# Patient Record
Sex: Female | Born: 1980 | Hispanic: Yes | Marital: Married | Smoking: Never smoker
Health system: Southern US, Community
[De-identification: ages and names within clinical notes are randomized; demographics above are authoritative.]

## PROBLEM LIST (undated history)

## (undated) ENCOUNTER — Inpatient Hospital Stay (HOSPITAL_COMMUNITY): Payer: Self-pay

## (undated) DIAGNOSIS — R0600 Dyspnea, unspecified: Secondary | ICD-10-CM

## (undated) DIAGNOSIS — K219 Gastro-esophageal reflux disease without esophagitis: Secondary | ICD-10-CM

## (undated) DIAGNOSIS — O24419 Gestational diabetes mellitus in pregnancy, unspecified control: Secondary | ICD-10-CM

## (undated) DIAGNOSIS — R7303 Prediabetes: Secondary | ICD-10-CM

## (undated) HISTORY — PX: NO PAST SURGERIES: SHX2092

---

## 2016-05-14 ENCOUNTER — Ambulatory Visit (INDEPENDENT_AMBULATORY_CARE_PROVIDER_SITE_OTHER): Payer: 59 | Admitting: Physician Assistant

## 2016-05-14 VITALS — HR 83 | Temp 98.6°F | Resp 16 | Ht 64.0 in | Wt 150.8 lb

## 2016-05-14 DIAGNOSIS — R1013 Epigastric pain: Secondary | ICD-10-CM

## 2016-05-14 DIAGNOSIS — K2970 Gastritis, unspecified, without bleeding: Secondary | ICD-10-CM

## 2016-05-14 LAB — POCT CBC
GRANULOCYTE PERCENT: 77.6 % (ref 37–80)
HEMATOCRIT: 37.5 % — AB (ref 37.7–47.9)
HEMOGLOBIN: 13.3 g/dL (ref 12.2–16.2)
Lymph, poc: 1.7 (ref 0.6–3.4)
MCH: 29.8 pg (ref 27–31.2)
MCHC: 35.4 g/dL (ref 31.8–35.4)
MCV: 84.3 fL (ref 80–97)
MID (cbc): 0.6 (ref 0–0.9)
MPV: 7.4 fL (ref 0–99.8)
POC GRANULOCYTE: 7.9 — AB (ref 2–6.9)
POC LYMPH PERCENT: 16.4 %L (ref 10–50)
POC MID %: 6 % (ref 0–12)
Platelet Count, POC: 203 10*3/uL (ref 142–424)
RBC: 4.45 M/uL (ref 4.04–5.48)
RDW, POC: 13.2 %
WBC: 10.2 10*3/uL (ref 4.6–10.2)

## 2016-05-14 MED ORDER — SUCRALFATE 1 GM/10ML PO SUSP: 1.0000 g | Freq: Three times a day (TID) | ORAL | 0 refills | Status: DC

## 2016-05-14 NOTE — Patient Instructions (Addendum)
Zantac 150mg  up to 2x/day if you get this in the future  Use the liquid medication that I gave you today for the next several days to help with the pain in your stomach  Eat a bland diet - be mindful of spicy and greasy foods as this will possibly make your pain worse   Gastritis, Adult Gastritis is soreness and swelling (inflammation) of the lining of the stomach. Gastritis can develop as a sudden onset (acute) or long-term (chronic) condition. If gastritis is not treated, it can lead to stomach bleeding and ulcers. CAUSES  Gastritis occurs when the stomach lining is weak or damaged. Digestive juices from the stomach then inflame the weakened stomach lining. The stomach lining may be weak or damaged due to viral or bacterial infections. One common bacterial infection is the Helicobacter pylori infection. Gastritis can also result from excessive alcohol consumption, taking certain medicines, or having too much acid in the stomach.  SYMPTOMS  In some cases, there are no symptoms. When symptoms are present, they may include:  Pain or a burning sensation in the upper abdomen.  Nausea.  Vomiting.  An uncomfortable feeling of fullness after eating. DIAGNOSIS  Your caregiver may suspect you have gastritis based on your symptoms and a physical exam. To determine the cause of your gastritis, your caregiver may perform the following:  Blood or stool tests to check for the H pylori bacterium.  Gastroscopy. A thin, flexible tube (endoscope) is passed down the esophagus and into the stomach. The endoscope has a light and camera on the end. Your caregiver uses the endoscope to view the inside of the stomach.  Taking a tissue sample (biopsy) from the stomach to examine under a microscope. TREATMENT  Depending on the cause of your gastritis, medicines may be prescribed. If you have a bacterial infection, such as an H pylori infection, antibiotics may be given. If your gastritis is caused by too  much acid in the stomach, H2 blockers or antacids may be given. Your caregiver may recommend that you stop taking aspirin, ibuprofen, or other nonsteroidal anti-inflammatory drugs (NSAIDs). HOME CARE INSTRUCTIONS  Only take over-the-counter or prescription medicines as directed by your caregiver.  If you were given antibiotic medicines, take them as directed. Finish them even if you start to feel better.  Drink enough fluids to keep your urine clear or pale yellow.  Avoid foods and drinks that make your symptoms worse, such as:  Caffeine or alcoholic drinks.  Chocolate.  Peppermint or mint flavorings.  Garlic and onions.  Spicy foods.  Citrus fruits, such as oranges, lemons, or limes.  Tomato-based foods such as sauce, chili, salsa, and pizza.  Fried and fatty foods.  Eat small, frequent meals instead of large meals. SEEK IMMEDIATE MEDICAL CARE IF:   You have black or dark red stools.  You vomit blood or material that looks like coffee grounds.  You are unable to keep fluids down.  Your abdominal pain gets worse.  You have a fever.  You do not feel better after 1 week.  You have any other questions or concerns. MAKE SURE YOU:  Understand these instructions.  Will watch your condition.  Will get help right away if you are not doing well or get worse.   This information is not intended to replace advice given to you by your health care provider. Make sure you discuss any questions you have with your health care provider.   Document Released: 08/20/2001 Document Revised: 02/25/2012 Document  Reviewed: 10/09/2011 Elsevier Interactive Patient Education [employer]2016 Elsevier Inc.   IF you received an x-ray today, you will receive an invoice from [facility]. Please contact [facility] at [phone] with questions or concerns regarding your invoice.   IF you received labwork today, you will receive an invoice from [facility].  Please contact [facility] at [phone] with questions or concerns regarding your invoice.   Our billing staff will not be able to assist you with questions regarding bills from these companies.  You will be contacted with the lab results as soon as they are available. The fastest way to get your results is to activate your My Chart account. Instructions are located on the last page of this paperwork. If you have not heard from us regarding the results in 2 weeks, please contact this office.

## 2016-05-14 NOTE — Progress Notes (Signed)
Cassandra Peters  MRN: [id number] DOB: 06-29-1981  Subjective:  Pt presents to clinic with epigastric abd pain that started about 12 hours ago after eating at [facility] and ate a lot of different food than she normally does that was fried - the pain has not changed since it started.  She felt nauseated and she vomited once this am but it only made her feel slightly better.  She is currently not nauseated.  Laying makes the worse pain - standing makes her feel a little relief.  She tried something OTC to help with nausea about 6am.  No F/C and no sick contacts.  She drinks on ETOH and has a cup of coffee in the am with cream.  She has felt constipated for the last 2 days - yesterday am she had a normal BM for her.    Post partum  5 months- breastfeeding  Husband with patient - he interprets  Review of Systems  Constitutional: Negative for chills and fever.  Gastrointestinal: Positive for abdominal pain, constipation (2 days), nausea and vomiting. Negative for anal bleeding.  Genitourinary: Negative for dysuria and urgency.    There are no active problems to display for this patient.   No current outpatient prescriptions on file prior to visit.   No current facility-administered medications on file prior to visit.     No Known Allergies  Pt patients past, family and social history were reviewed and updated.  Objective:  BP (P) 121/82 (BP Location: Left Arm, Patient Position: Supine, Cuff Size: Normal)   Pulse 83   Temp 98.6 F (37 C) (Oral)   Resp 16   Ht 5\' 4"  (1.626 m)   Wt 150 lb 12.8 oz (68.4 kg)   SpO2 98%   BMI 25.88 kg/m   Physical Exam  Constitutional: She is oriented to person, place, and time and well-developed, well-nourished, and in no distress.  HENT:  Head: Normocephalic and atraumatic.  Right Ear: Hearing and external ear normal.  Left Ear: Hearing and external ear normal.  Eyes: Conjunctivae are normal.  Neck: Normal range of motion.  Cardiovascular:  Normal rate, regular rhythm and normal heart sounds.   No murmur heard. Pulmonary/Chest: Effort normal and breath sounds normal. She has no wheezes.  Abdominal: Soft. Bowel sounds are normal. She exhibits no mass. There is tenderness (epigastric). There is no rebound, no guarding and negative Murphy's sign.  Neurological: She is alert and oriented to person, place, and time. Gait normal.  Skin: Skin is warm and dry.  Psychiatric: Mood, memory, affect and judgment normal.  Vitals reviewed.   Results for orders placed or performed in visit on 05/14/16  COMPLETE METABOLIC PANEL WITH GFR  Result Value Ref Range   Sodium 140 135 - 146 mmol/L   Potassium 3.8 3.5 - 5.3 mmol/L   Chloride 103 98 - 110 mmol/L   CO2 21 20 - 31 mmol/L   Glucose, Bld 111 (H) 65 - 99 mg/dL   BUN 10 7 - 25 mg/dL   Creat 4.09 8.11 - 9.14 mg/dL   Total Bilirubin 0.5 0.2 - 1.2 mg/dL   Alkaline Phosphatase 74 33 - 115 U/L   AST 13 10 - 30 U/L   ALT 11 6 - 29 U/L   Total Protein 7.1 6.1 - 8.1 g/dL   Albumin 4.2 3.6 - 5.1 g/dL   Calcium 9.3 8.6 - 78.2 mg/dL   GFR, Est African American >89 >=60 mL/min   GFR, Est Non African  American >89 >=60 mL/min  POCT CBC  Result Value Ref Range   WBC 10.2 4.6 - 10.2 K/uL   Lymph, poc 1.7 0.6 - 3.4   POC LYMPH PERCENT 16.4 10 - 50 %L   MID (cbc) 0.6 0 - 0.9   POC MID % 6.0 0 - 12 %M   POC Granulocyte 7.9 (A) 2 - 6.9   Granulocyte percent 77.6 37 - 80 %G   RBC 4.45 4.04 - 5.48 M/uL   Hemoglobin 13.3 12.2 - 16.2 g/dL   HCT, POC 16.137.5 (A) 09.637.7 - 47.9 %   MCV 84.3 80 - 97 fL   MCH, POC 29.8 27 - 31.2 pg   MCHC 35.4 31.8 - 35.4 g/dL   RDW, POC 04.513.2 %   Platelet Count, POC 203 142 - 424 K/uL   MPV 7.4 0 - 99.8 fL    Assessment and Plan :  Abdominal pain, epigastric - Plan: POCT CBC, COMPLETE METABOLIC PANEL WITH GFR  Gastritis - Plan: sucralfate (CARAFATE) 1 GM/10ML suspension   With normal LFTs - unlikely to be gallbladder - gastritis is the most likely with her new  fried foods that she ate - we will treat her symptoms with carafate and she will eat bland foods and be aware that caffeinated beverages may make this worse.  Questions were answered and they agree with the plan.  Warning signs of when to RTC were discussed with patient and husband.  Benny LennertSarah Weber PA-C  [facility] 05/16/2016 12:11 PM

## 2016-05-14 NOTE — Progress Notes (Signed)
Patient comes in with sudden epigastric intermit sharp pains that started after [place] [employer] [place] 1 [place] ago, 75month old baby, breastfeeding Vomited x2, last bm yesterday morning

## 2016-05-15 LAB — COMPLETE METABOLIC PANEL WITH GFR
ALT: 11 U/L (ref 6–29)
AST: 13 U/L (ref 10–30)
Albumin: 4.2 g/dL (ref 3.6–5.1)
Alkaline Phosphatase: 74 U/L (ref 33–115)
BUN: 10 mg/dL (ref 7–25)
CHLORIDE: 103 mmol/L (ref 98–110)
CO2: 21 mmol/L (ref 20–31)
Calcium: 9.3 mg/dL (ref 8.6–10.2)
Creat: 0.59 mg/dL (ref 0.50–1.10)
Glucose, Bld: 111 mg/dL — ABNORMAL HIGH (ref 65–99)
POTASSIUM: 3.8 mmol/L (ref 3.5–5.3)
Sodium: 140 mmol/L (ref 135–146)
Total Bilirubin: 0.5 mg/dL (ref 0.2–1.2)
Total Protein: 7.1 g/dL (ref 6.1–8.1)

## 2016-05-16 ENCOUNTER — Encounter: Payer: Self-pay | Admitting: Physician Assistant

## 2016-08-13 ENCOUNTER — Ambulatory Visit (INDEPENDENT_AMBULATORY_CARE_PROVIDER_SITE_OTHER): Payer: 59 | Admitting: Physician Assistant

## 2016-08-13 VITALS — BP 122/72 | HR 74 | Temp 98.9°F | Resp 17 | Ht 63.5 in | Wt 152.0 lb

## 2016-08-13 DIAGNOSIS — K5900 Constipation, unspecified: Secondary | ICD-10-CM

## 2016-08-13 DIAGNOSIS — Z114 Encounter for screening for human immunodeficiency virus [HIV]: Secondary | ICD-10-CM

## 2016-08-13 DIAGNOSIS — Z833 Family history of diabetes mellitus: Secondary | ICD-10-CM | POA: Diagnosis not present

## 2016-08-13 DIAGNOSIS — K649 Unspecified hemorrhoids: Secondary | ICD-10-CM

## 2016-08-13 DIAGNOSIS — Z0000 Encounter for general adult medical examination without abnormal findings: Secondary | ICD-10-CM | POA: Diagnosis not present

## 2016-08-13 DIAGNOSIS — Z01419 Encounter for gynecological examination (general) (routine) without abnormal findings: Secondary | ICD-10-CM | POA: Diagnosis not present

## 2016-08-13 LAB — POCT GLYCOSYLATED HEMOGLOBIN (HGB A1C): Hemoglobin A1C: 5.6

## 2016-08-13 MED ORDER — POLYETHYLENE GLYCOL 3350 17 GM/SCOOP PO POWD: 17.0000 g | Freq: Two times a day (BID) | ORAL | 1 refills | Status: DC | PRN

## 2016-08-13 MED ORDER — HYDROCORTISONE 2.5 % RE CREA: 1.0000 "application " | TOPICAL_CREAM | Freq: Two times a day (BID) | RECTAL | 0 refills | Status: DC

## 2016-08-13 NOTE — Patient Instructions (Addendum)
Use anusol as directed for hemorrhoids. Relieving your constipation will also help with this. Use a small stool in front of your toilet to put your feet on while you have a bowel movement. This helps relax the muscles around your rectum while moving your bowels.  Please see instructions below for relief of constipation.   You will be contacted with the results of your lab work when it results.  Thank you for coming in today. I hope you feel we met your needs.  Feel free to call [facility] if you have any questions or further requests.  Please consider signing up for MyChart if you do not already have it, as this is a great way to communicate with me.  Best,  Whitney McVey, PA-C  For constipation   Make sure you are drinking enough water daily. Increase to at least 2 liters a day.  Make sure you are getting enough fiber in your diet - this will make you regular - you can eat high fiber foods or use metamucil as a supplement - it is really important to drink enough water when using fiber supplements.   If your stools are hard or are formed balls or you have to strain a stool softener will help - use colace 2-3 capsule a day  For gentle treatment of constipation Use Miralax 1-2 capfuls a day until your stools are soft and regular and then decrease the usage - you can use this daily  For more aggressive treatment of constipation Use 4 capfuls of Colace and 6 doses of Miralax and drink it in 2 hours - this should result in several watery stools - if it does not repeat the next day and then go to daily miralax for a week to make sure your bowels are clean and retrained to work properly  For the most aggressive treatment of constipation Use 14 capfuls of Miralax in 1 gallon of fluid (gatoraid or water work well or a combination of the two) and drink over 12h - it is ok to eat during this time and then use Miralax 1 capful daily for about 2 weeks to prevent the constipation from returning  Health  Maintenance, Female Introduction Adopting a healthy lifestyle and getting preventive care can go a long way to promote health and wellness. Talk with your health care provider about what schedule of regular examinations is right for you. This is a good chance for you to check in with your provider about disease prevention and staying healthy. In between checkups, there are plenty of things you can do on your own. Experts have done a lot of research about which lifestyle changes and preventive measures are most likely to keep you healthy. Ask your health care provider for more information. Weight and diet Eat a healthy diet  Be sure to include plenty of vegetables, fruits, low-fat dairy products, and lean protein.  Do not eat a lot of foods high in solid fats, added sugars, or salt.  Get regular exercise. This is one of the most important things you can do for your health.  Most adults should exercise for at least 150 minutes each week. The exercise should increase your heart rate and make you sweat (moderate-intensity exercise).  Most adults should also do strengthening exercises at least twice a week. This is in addition to the moderate-intensity exercise. Maintain a healthy weight  Body mass index (BMI) is a measurement that can be used to identify possible weight problems. It estimates body  fat based on height and weight. Your health care provider can help determine your BMI and help you achieve or maintain a healthy weight.  For females 32 years of age and older:  A BMI below 18.5 is considered underweight.  A BMI of 18.5 to 24.9 is normal.  A BMI of 25 to 29.9 is considered overweight.  A BMI of 30 and above is considered obese. Watch levels of cholesterol and blood lipids  You should start having your blood tested for lipids and cholesterol at 33 years of age, then have this test every 5 years.  You may need to have your cholesterol levels checked more often if:  Your lipid  or cholesterol levels are high.  You are older than 33 years of age.  You are at high risk for heart disease. Cancer screening Lung Cancer  Lung cancer screening is recommended for adults 107-35 years old who are at high risk for lung cancer because of a history of smoking.  A yearly low-dose CT scan of the lungs is recommended for people who:  Currently smoke.  Have quit within the past 15 years.  Have at least a 30-pack-year history of smoking. A pack year is smoking an average of one pack of cigarettes a day for 1 year.  Yearly screening should continue until it has been 15 years since you quit.  Yearly screening should stop if you develop a health problem that would prevent you from having lung cancer treatment. Breast Cancer  Practice breast self-awareness. This means understanding how your breasts normally appear and feel.  It also means doing regular breast self-exams. Let your health care provider know about any changes, no matter how small.  If you are in your 20s or 30s, you should have a clinical breast exam (CBE) by a health care provider every 1-3 years as part of a regular health exam.  If you are 68 or older, have a CBE every year. Also consider having a breast X-ray (mammogram) every year.  If you have a family history of breast cancer, talk to your health care provider about genetic screening.  If you are at high risk for breast cancer, talk to your health care provider about having an MRI and a mammogram every year.  Breast cancer gene (BRCA) assessment is recommended for women who have family members with BRCA-related cancers. BRCA-related cancers include:  Breast.  Ovarian.  Tubal.  Peritoneal cancers.  Results of the assessment will determine the need for genetic counseling and BRCA1 and BRCA2 testing. Cervical Cancer  Your health care provider may recommend that you be screened regularly for cancer of the pelvic organs (ovaries, uterus, and vagina).  This screening involves a pelvic examination, including checking for microscopic changes to the surface of your cervix (Pap test). You may be encouraged to have this screening done every 3 years, beginning at age 20.  For women ages 16-65, health care providers may recommend pelvic exams and Pap testing every 3 years, or they may recommend the Pap and pelvic exam, combined with testing for human papilloma virus (HPV), every 5 years. Some types of HPV increase your risk of cervical cancer. Testing for HPV may also be done on women of any age with unclear Pap test results.  Other health care providers may not recommend any screening for nonpregnant women who are considered low risk for pelvic cancer and who do not have symptoms. Ask your health care provider if a screening pelvic exam is right for  you.  If you have had past treatment for cervical cancer or a condition that could lead to cancer, you need Pap tests and screening for cancer for at least 20 years after your treatment. If Pap tests have been discontinued, your risk factors (such as having a new sexual partner) need to be reassessed to determine if screening should resume. Some women have medical problems that increase the chance of getting cervical cancer. In these cases, your health care provider may recommend more frequent screening and Pap tests. Colorectal Cancer  This type of cancer can be detected and often prevented.  Routine colorectal cancer screening usually begins at 33 years of age and continues through 33 years of age.  Your health care provider may recommend screening at an earlier age if you have risk factors for colon cancer.  Your health care provider may also recommend using home test kits to check for hidden blood in the stool.  A small camera at the end of a tube can be used to examine your colon directly (sigmoidoscopy or colonoscopy). This is done to check for the earliest forms of colorectal cancer.  Routine  screening usually begins at age 53.  Direct examination of the colon should be repeated every 5-10 years through 33 years of age. However, you may need to be screened more often if early forms of precancerous polyps or small growths are found. Skin Cancer  Check your skin from head to toe regularly.  Tell your health care provider about any new moles or changes in moles, especially if there is a change in a mole's shape or color.  Also tell your health care provider if you have a mole that is larger than the size of a pencil eraser.  Always use sunscreen. Apply sunscreen liberally and repeatedly throughout the day.  Protect yourself by wearing long sleeves, pants, a wide-brimmed hat, and sunglasses whenever you are outside. Heart disease, diabetes, and high blood pressure  High blood pressure causes heart disease and increases the risk of stroke. High blood pressure is more likely to develop in:  People who have blood pressure in the high end of the normal range (130-139/85-89 mm Hg).  People who are overweight or obese.  People who are African American.  If you are 67-73 years of age, have your blood pressure checked every 3-5 years. If you are 36 years of age or older, have your blood pressure checked every year. You should have your blood pressure measured twice-once when you are at a hospital or clinic, and once when you are not at a hospital or clinic. Record the average of the two measurements. To check your blood pressure when you are not at a hospital or clinic, you can use:  An automated blood pressure machine at a pharmacy.  A home blood pressure monitor.  If you are between 92 years and 4 years old, ask your health care provider if you should take aspirin to prevent strokes.  Have regular diabetes screenings. This involves taking a blood sample to check your fasting blood sugar level.  If you are at a normal weight and have a low risk for diabetes, have this test once  every three years after 33 years of age.  If you are overweight and have a high risk for diabetes, consider being tested at a younger age or more often. Preventing infection Hepatitis B  If you have a higher risk for hepatitis B, you should be screened for this virus. You are  considered at high risk for hepatitis B if:  You were born in a country where hepatitis B is common. Ask your health care provider which countries are considered high risk.  Your parents were born in a high-risk country, and you have not been immunized against hepatitis B (hepatitis B vaccine).  You have HIV or AIDS.  You use needles to inject street drugs.  You live with someone who has hepatitis B.  You have had sex with someone who has hepatitis B.  You get hemodialysis treatment.  You take certain medicines for conditions, including cancer, organ transplantation, and autoimmune conditions. Hepatitis C  Blood testing is recommended for:  Everyone born from [birth year] through 1963.  Anyone with known risk factors for hepatitis C. Sexually transmitted infections (STIs)  You should be screened for sexually transmitted infections (STIs) including gonorrhea and chlamydia if:  You are sexually active and are younger than 33 years of age.  You are older than 33 years of age and your health care provider tells you that you are at risk for this type of infection.  Your sexual activity has changed since you were last screened and you are at an increased risk for chlamydia or gonorrhea. Ask your health care provider if you are at risk.  If you do not have HIV, but are at risk, it may be recommended that you take a prescription medicine daily to prevent HIV infection. This is called pre-exposure prophylaxis (PrEP). You are considered at risk if:  You are sexually active and do not regularly use condoms or know the HIV status of your partner(s).  You take drugs by injection.  You are sexually active with a partner  who has HIV. Talk with your health care provider about whether you are at high risk of being infected with HIV. If you choose to begin PrEP, you should first be tested for HIV. You should then be tested every 3 months for as long as you are taking PrEP. Pregnancy  If you are premenopausal and you may become pregnant, ask your health care provider about preconception counseling.  If you may become pregnant, take 400 to 800 micrograms (mcg) of folic acid every day.  If you want to prevent pregnancy, talk to your health care provider about birth control (contraception). Osteoporosis and menopause  Osteoporosis is a disease in which the bones lose minerals and strength with aging. This can result in serious bone fractures. Your risk for osteoporosis can be identified using a bone density scan.  If you are 37 years of age or older, or if you are at risk for osteoporosis and fractures, ask your health care provider if you should be screened.  Ask your health care provider whether you should take a calcium or vitamin D supplement to lower your risk for osteoporosis.  Menopause may have certain physical symptoms and risks.  Hormone replacement therapy may reduce some of these symptoms and risks. Talk to your health care provider about whether hormone replacement therapy is right for you. Follow these instructions at home:  Schedule regular health, dental, and eye exams.  Stay current with your immunizations.  Do not use any tobacco products including cigarettes, chewing tobacco, or electronic cigarettes.  If you are pregnant, do not drink alcohol.  If you are breastfeeding, limit how much and how often you drink alcohol.  Limit alcohol intake to no more than 1 drink per day for nonpregnant women. One drink equals 12 ounces of beer, 5 ounces of  wine, or 1 ounces of hard liquor.  Do not use street drugs.  Do not share needles.  Ask your health care provider for help if you need support  or information about quitting drugs.  Tell your health care provider if you often feel depressed.  Tell your health care provider if you have ever been abused or do not feel safe at home. This information is not intended to replace advice given to you by your health care provider. Make sure you discuss any questions you have with your health care provider. Document Released: 03/11/2011 Document Revised: 02/01/2016 Document Reviewed: 05/30/2015  2017 Elsevier   IF you received an x-ray today, you will receive an invoice from [facility]. Please contact [facility] at [phone] with questions or concerns regarding your invoice.   IF you received labwork today, you will receive an invoice from [employer]. Please contact [facility] at [phone] with questions or concerns regarding your invoice.   Our billing staff will not be able to assist you with questions regarding bills from these companies.  You will be contacted with the lab results as soon as they are available. The fastest way to get your results is to activate your My Chart account. Instructions are located on the last page of this paperwork. If you have not heard from [place] regarding the results in 2 weeks, please contact this office.

## 2016-08-13 NOTE — Progress Notes (Signed)
[facility] ([place]) [address] [place] [address] [phone]  Date:  08/13/2016   Name:  Cassandra Peters   DOB:  1980-09-10   MRN:  [id number]  PCP:  No PCP Per Patient    Chief Complaint: female issues   History of Present Illness:  This is a 35 y.o. female with no reported PMH who is presenting for CPE.  She is from [place], here today with husband who is interpreting for her.   Complaints: Hemorrhoids. She had a normal vaginal delivery eight months ago and has had problems with hemorrhoids since that time. Only gives her problems  BM once every two days. Today she has not had a bowel movement in four days. Bright red blood in stool. Has tried drinking 2 Tbsp olive oil for her constipation, helps some.  Currently breast feeding.   LMP: 10 days ago. Normal. Contraception: None.  Last pap: Never.  Sexual history: Married x two years.  Immunizations: Decline flu shot today. Current on Tdap.  Dentist: Sees regulary Eye:  Not checked.  Diet/Exercise: Cooks at home only: Eats a healthy diet of fruit, vegetables, meat, chicken. Does not exercise.  Fam hx: Mother diabetes, HTN.  Tobacco/alcohol/substance use: None  Review of Systems:  Review of Systems  Constitutional: Negative for chills, diaphoresis, fatigue and fever.  HENT: Negative for congestion, postnasal drip, rhinorrhea, sinus pressure, sneezing and sore throat.   Respiratory: Negative for cough, chest tightness, shortness of breath and wheezing.   Cardiovascular: Negative for chest pain and palpitations.  Gastrointestinal: Positive for constipation and rectal pain. Negative for abdominal pain, diarrhea, nausea and vomiting.  Neurological: Negative for weakness, light-headedness and headaches.    There are no active problems to display for this patient.   Prior to Admission medications   Medication Sig Start Date End Date Taking? Authorizing Provider  sucralfate (CARAFATE) 1 GM/10ML suspension Take  10 mLs (1 g total) by mouth 4 (four) times daily -  with meals and at bedtime. 05/14/16  Yes Mancel Bale, PA-C    No Known Allergies  No past surgical history on file.  Social History  Substance Use Topics  . Smoking status: Never Smoker  . Smokeless tobacco: Never Used  . Alcohol use No    Family History  Problem Relation Age of Onset  . Diabetes Mother   . Hypertension Mother     Medication list has been reviewed and updated.  Physical Examination:  Physical Exam  Constitutional: She is oriented to person, place, and time. She appears well-developed and well-nourished. No distress.  HENT:  Head: Normocephalic and atraumatic.  Right Ear: Tympanic membrane normal.  Left Ear: Tympanic membrane normal.  Mouth/Throat: Oropharynx is clear and moist and mucous membranes are normal.  Eyes: Conjunctivae and EOM are normal. Pupils are equal, round, and reactive to light.  Neck: Normal range of motion. Neck supple.  Cardiovascular: Normal rate, regular rhythm and normal heart sounds.   No murmur heard. Pulmonary/Chest: Effort normal and breath sounds normal. She has no wheezes. Right breast exhibits no tenderness. Left breast exhibits no tenderness.  No masses noted in breasts or axilla b/l.   Abdominal: Soft. Normal appearance and bowel sounds are normal. She exhibits no mass. There is no tenderness.  Genitourinary: Vagina normal.  Musculoskeletal: Normal range of motion.  Neurological: She is alert and oriented to person, place, and time. She has normal reflexes.  Skin: Skin is warm and dry.  Psychiatric: She has a  normal mood and affect. Her behavior is normal. Judgment and thought content normal.  Vitals reviewed.   BP 122/72 (BP Location: Right Arm, Patient Position: Sitting, Cuff Size: Normal)   Pulse 74   Temp 98.9 F (37.2 C) (Oral)   Resp 17   Ht 5' 3.5" (1.613 m)   Wt 152 lb (68.9 kg)   LMP 07/30/2016 (Approximate)   SpO2 98%   BMI 26.50 kg/m   Assessment  and Plan: 1. Annual physical exam 2. Encounter for routine GYN examination  - Lipid panel - CBC - HIV antibody - Pap IG and HPV (high risk) DNA detection - Chris is a healthy 35 year old breastfeeding female. She recently gave birth to her first child, currently breastfeeding with no problems. Encouraged regular exercise, increased fluids and healthy diet. Labs pending. Will contact with results.   3. Hemorrhoids, unspecified hemorrhoid type - hydrocortisone (ANUSOL-HC) 2.5 % rectal cream; Place 1 application rectally 2 (two) times daily.  Dispense: 30 g; Refill: 0  4. Constipation, unspecified constipation type - polyethylene glycol powder (GLYCOLAX/MIRALAX) powder; Take 17 g by mouth 2 (two) times daily as needed.  Dispense: 578 g; Refill: 1  5. Screening for HIV (human immunodeficiency virus) 6. Family history of diabetes mellitus - POCT glycosylated hemoglobin (Hb A1C) - CMP14+EGFR   Mercer Pod, PA-C  [facility] 08/13/2016 10:09 AM

## 2016-08-14 LAB — CMP14+EGFR
ALT: 16 IU/L (ref 0–32)
AST: 17 IU/L (ref 0–40)
Albumin/Globulin Ratio: 2 (ref 1.2–2.2)
Albumin: 4.5 g/dL (ref 3.5–5.5)
Alkaline Phosphatase: 71 IU/L (ref 39–117)
BUN/Creatinine Ratio: 21 (ref 9–23)
BUN: 12 mg/dL (ref 6–20)
Bilirubin Total: <0.2 mg/dL (ref 0.0–1.2)
CO2: 23 mmol/L (ref 18–29)
Calcium: 9.5 mg/dL (ref 8.7–10.2)
Chloride: 106 mmol/L (ref 96–106)
Creatinine, Ser: 0.57 mg/dL (ref 0.57–1.00)
GFR calc Af Amer: 141 mL/min/{1.73_m2} (ref >59)
GFR calc non Af Amer: 122 mL/min/{1.73_m2} (ref >59)
Globulin, Total: 2.3 g/dL (ref 1.5–4.5)
Glucose: 84 mg/dL (ref 65–99)
Potassium: 4.1 mmol/L (ref 3.5–5.2)
Sodium: 145 mmol/L — ABNORMAL HIGH (ref 134–144)
Total Protein: 6.8 g/dL (ref 6.0–8.5)

## 2016-08-14 LAB — CBC
Hematocrit: 36.7 % (ref 34.0–46.6)
Hemoglobin: 12.8 g/dL (ref 11.1–15.9)
MCH: 30.7 pg (ref 26.6–33.0)
MCHC: 34.9 g/dL (ref 31.5–35.7)
MCV: 88 fL (ref 79–97)
Platelets: 255 10*3/uL (ref 150–379)
RBC: 4.17 x10E6/uL (ref 3.77–5.28)
RDW: 13.8 % (ref 12.3–15.4)
WBC: 5.7 10*3/uL (ref 3.4–10.8)

## 2016-08-14 LAB — LIPID PANEL
Chol/HDL Ratio: 2.9 ratio units (ref 0.0–4.4)
Cholesterol, Total: 158 mg/dL (ref 100–199)
HDL: 54 mg/dL (ref >39)
LDL Calculated: 91 mg/dL (ref 0–99)
Triglycerides: 65 mg/dL (ref 0–149)
VLDL Cholesterol Cal: 13 mg/dL (ref 5–40)

## 2016-08-14 LAB — HIV ANTIBODY (ROUTINE TESTING W REFLEX): HIV Screen 4th Generation wRfx: NONREACTIVE

## 2016-08-17 LAB — PAP IG AND HPV HIGH-RISK
HPV, high-risk: NEGATIVE
PAP Smear Comment: 0

## 2016-08-31 ENCOUNTER — Encounter: Payer: Self-pay | Admitting: Physician Assistant

## 2017-05-05 LAB — OB RESULTS CONSOLE RUBELLA ANTIBODY, IGM: RUBELLA: IMMUNE

## 2017-05-05 LAB — OB RESULTS CONSOLE ANTIBODY SCREEN: Antibody Screen: NEGATIVE

## 2017-05-05 LAB — OB RESULTS CONSOLE RPR: RPR: NONREACTIVE

## 2017-05-05 LAB — OB RESULTS CONSOLE ABO/RH: RH TYPE: POSITIVE

## 2017-05-05 LAB — OB RESULTS CONSOLE HIV ANTIBODY (ROUTINE TESTING): HIV: NONREACTIVE

## 2017-05-05 LAB — OB RESULTS CONSOLE HEPATITIS B SURFACE ANTIGEN: HEP B S AG: NEGATIVE

## 2017-09-09 NOTE — L&D Delivery Note (Addendum)
Operative Delivery Note  At 12:23 PM a viable female was delivered via Vaginal, Vacuum Investment banker, operational(Extractor).  Presentation: vertex; Position: Occiput,, Anterior; Station: +4.  Verbal consent: obtained from patient.  Risks and benefits discussed in detail.  Risks include, but are not limited to the risks of anesthesia, bleeding, infection, damage to maternal tissues, fetal cephalhematoma.  There is also the risk of inability to effect vaginal delivery of the head, or shoulder dystocia that cannot be resolved by established maneuvers, leading to the need for emergency cesarean section.  Delivery performed by Dr. Erin FullingHarraway-Smith and Dr. Raynelle FanningJulie Degele due to fetal bradycardia and need for emergent delivery.    APGAR: 5, 8; weight 5 lb 13 oz (2635 g).   Placenta status: to pathology Cord:  with the following complications: .  Cord pH: pending  Anesthesia:  Local Instruments: Kiwi Episiotomy: None Lacerations: 3rd degree (3b) Repair was performed in the usual fashion. Suture Repair: 2.0 3.0 vicryl Est. Blood Loss (mL): 350  Mom to postpartum.  Baby to Couplet care / Skin to Skin.  Cassandra SellerJennifer M Laquiesha Peters 12/11/2017, 2:17 PM

## 2017-10-10 ENCOUNTER — Encounter: Payer: 59 | Attending: Obstetrics & Gynecology | Admitting: *Deleted

## 2017-10-10 DIAGNOSIS — R7309 Other abnormal glucose: Secondary | ICD-10-CM

## 2017-10-10 DIAGNOSIS — O99810 Abnormal glucose complicating pregnancy: Secondary | ICD-10-CM | POA: Diagnosis not present

## 2017-10-10 DIAGNOSIS — Z3A00 Weeks of gestation of pregnancy not specified: Secondary | ICD-10-CM | POA: Insufficient documentation

## 2017-10-10 DIAGNOSIS — Z713 Dietary counseling and surveillance: Secondary | ICD-10-CM | POA: Diagnosis not present

## 2017-10-10 NOTE — Progress Notes (Signed)
  Patient was seen on 10/10/2017 for Gestational Diabetes self-management . Used [employer] interpretor for Arabic language. Husband also participated in the visit. The following learning objectives were met by the patient :   States the definition of Gestational Diabetes  States why dietary management is important in controlling blood glucose  Describes the effects of carbohydrates on blood glucose levels  Demonstrates ability to create a balanced meal plan  Demonstrates carbohydrate counting   States when to check blood glucose levels  Demonstrates proper blood glucose monitoring techniques  States the effect of stress and exercise on blood glucose levels  States the importance of limiting caffeine and abstaining from alcohol and smoking  Plan:  Aim for 3 Carb Choices per meal (45 grams) +/- 1 either way  Aim for 1-2 Carbs per snack Begin reading food labels for Total Carbohydrate of foods Consider  increasing your activity level by walking or other activity daily as tolerated Begin checking BG before breakfast and 2 hours after first bite of breakfast, lunch and dinner as directed by MD  Bring Log Book to every medical appointment   Take medication if directed by MD  Patient already has a meter:  And is testing pre breakfast and 2 hours each meal as directed by MD Review of Log Book shows: FBG are below 95 and post meal are all below 120 mg/dl except for one lunch test. She realizes she had OJ at that meal, so will stop drinking juice for now.    Patient instructed to monitor glucose levels: FBS: 60 - 95 mg/dl 2 hour: <120 mg/dl  Patient received the following handouts: in Arabic  Nutrition Diabetes and Pregnancy  Carbohydrate Counting List  Patient will be seen for follow-up as needed.

## 2017-11-10 ENCOUNTER — Encounter (HOSPITAL_COMMUNITY): Payer: Self-pay | Admitting: *Deleted

## 2017-11-10 ENCOUNTER — Inpatient Hospital Stay (HOSPITAL_COMMUNITY)
Admission: AD | Admit: 2017-11-10 | Discharge: 2017-11-10 | Disposition: A | Discharge status: Home / Self Care | Payer: 59 | Source: Ambulatory Visit | Attending: Obstetrics & Gynecology | Admitting: Obstetrics & Gynecology

## 2017-11-10 ENCOUNTER — Inpatient Hospital Stay (HOSPITAL_COMMUNITY): Payer: 59

## 2017-11-10 DIAGNOSIS — O368330 Maternal care for abnormalities of the fetal heart rate or rhythm, third trimester, not applicable or unspecified: Secondary | ICD-10-CM | POA: Insufficient documentation

## 2017-11-10 DIAGNOSIS — O24419 Gestational diabetes mellitus in pregnancy, unspecified control: Secondary | ICD-10-CM | POA: Diagnosis not present

## 2017-11-10 DIAGNOSIS — Z79899 Other long term (current) drug therapy: Secondary | ICD-10-CM | POA: Diagnosis not present

## 2017-11-10 DIAGNOSIS — Z3689 Encounter for other specified antenatal screening: Secondary | ICD-10-CM

## 2017-11-10 DIAGNOSIS — O288 Other abnormal findings on antenatal screening of mother: Secondary | ICD-10-CM | POA: Insufficient documentation

## 2017-11-10 DIAGNOSIS — Z8249 Family history of ischemic heart disease and other diseases of the circulatory system: Secondary | ICD-10-CM | POA: Insufficient documentation

## 2017-11-10 DIAGNOSIS — Z833 Family history of diabetes mellitus: Secondary | ICD-10-CM | POA: Diagnosis not present

## 2017-11-10 DIAGNOSIS — Z3A34 34 weeks gestation of pregnancy: Secondary | ICD-10-CM | POA: Insufficient documentation

## 2017-11-10 HISTORY — DX: Gestational diabetes mellitus in pregnancy, unspecified control: O24.419

## 2017-11-10 NOTE — Discharge Instructions (Signed)
Keep your appointments in the office. Return if you are having regular contractions, leaking of fluid or vaginal bleeding.

## 2017-11-10 NOTE — MAU Note (Signed)
Pt sent from MD office, non-reactive NST there.  Pt C/O cramping in lower back, denies bleeding or LOF.  Reports good fetal movement.

## 2017-11-10 NOTE — MAU Provider Note (Signed)
  History     CSN: [id number]  Arrival date and time: 11/10/17 1628   First Provider Initiated Contact with Patient 11/10/17 1716      Chief Complaint  Patient presents with  . non reactive NST   HPI Cassandra Peters 37 y.o. 5813w5d Arabic interpreter used for interview, exam and discharge.   Sent from the office with nonreactive NST.  Needs prolonged monitoring with BPP here.  Client is having some contractions but not very different from the contractions that she has had in the last few weeks.  No bleeding and no leaking.  Not having pain.  OB History    Gravida Para Term Preterm AB Living   2 1 1     1    SAB TAB Ectopic Multiple Live Births                  Past Medical History:  Diagnosis Date  . Gestational diabetes     Past Surgical History:  Procedure Laterality Date  . NO PAST SURGERIES      Family History  Problem Relation Age of Onset  . Diabetes Mother   . Hypertension Mother     Social History   Tobacco Use  . Smoking status: Never Smoker  . Smokeless tobacco: Never Used  Substance Use Topics  . Alcohol use: No  . Drug use: No    Allergies: No Known Allergies  Medications Prior to Admission  Medication Sig Dispense Refill Last Dose  . hydrocortisone (ANUSOL-HC) 2.5 % rectal cream Place 1 application rectally 2 (two) times daily. 30 g 0   . polyethylene glycol powder (GLYCOLAX/MIRALAX) powder Take 17 g by mouth 2 (two) times daily as needed. 578 g 1   . Prenatal Vit-Fe Fumarate-FA (MULTIVITAMIN-PRENATAL) 27-0.8 MG TABS tablet Take 1 tablet by mouth daily at 12 noon.       Review of Systems  Constitutional: Negative for fever.  Gastrointestinal: Negative for abdominal pain.  Genitourinary: Negative for vaginal bleeding and vaginal discharge.  Musculoskeletal: Positive for back pain.   Physical Exam   Blood pressure 111/69, pulse 87, temperature 98.7 F (37.1 C), temperature source Oral, resp. rate 16, weight 157 lb (71.2 kg).  Physical Exam   Nursing note and vitals reviewed. Constitutional: She is oriented to person, place, and time. She appears well-developed and well-nourished.  HENT:  Head: Normocephalic.  Eyes: EOM are normal.  Neck: Neck supple.  GI: Soft. There is no tenderness. There is no rebound and no guarding.  On fetal monitor FHT baseline is 135 with moderate variability and 15x15 accels noted.  Had one variable deceleratioin with quick recovery.  No variables since the BPP.  Occasional contraction.  Reactive NST  Musculoskeletal: Normal range of motion.  Neurological: She is alert and oriented to person, place, and time.  Skin: Skin is warm and dry.  Psychiatric: She has a normal mood and affect.    MAU Course  Procedures  MDM BPP 8/8 Reviewed results with Dr. Charlotta Newtonzan - will discharge  Assessment and Plan  Reactive NST BPP 8/8  Plan Will discharge and follow up in the office as scheduled.  Shawntell Dixson L Valetta Mulroy 11/10/2017, 5:25 PM

## 2017-11-10 NOTE — Progress Notes (Signed)
Discharge instructions given via [employer] interpreter.

## 2017-11-19 LAB — OB RESULTS CONSOLE GBS: GBS: NEGATIVE

## 2017-12-03 ENCOUNTER — Encounter (HOSPITAL_COMMUNITY): Payer: Self-pay | Admitting: *Deleted

## 2017-12-03 ENCOUNTER — Telehealth (HOSPITAL_COMMUNITY): Payer: Self-pay | Admitting: *Deleted

## 2017-12-03 NOTE — Telephone Encounter (Signed)
Preadmission screen  

## 2017-12-04 NOTE — Telephone Encounter (Signed)
Interpreter number [phone]

## 2017-12-10 NOTE — H&P (Signed)
HPI: 37 y/o G2P1001 @ 3923w1d estimated gestational age (as dated by LMP c/w 20 week ultrasound) presents for scheduled IOL.   no Leaking of Fluid,   no Vaginal Bleeding,   irregular Uterine Contractions,  + Fetal Movement.  Prenatal care has been provided by Dr. Charlotta Newtonzan  ROS: no HA, no epigastric pain, no visual changes.    Pregnancy complicated by: 1) GDMA1 -well controlled with diet -followed by weekly NST and serial growth.  Recent US noted decline in growth percentile- last completed @ 38w-vertex, 6#1oz (25%)   Prenatal Transfer Tool  Maternal Diabetes: No Genetic Screening: Normal Maternal Ultrasounds/Referrals: Normal Fetal Ultrasounds or other Referrals:  None Maternal Substance Abuse:  No Significant Maternal Medications:  None Significant Maternal Lab Results: Lab values include: Group B Strep negative   PNL:  GBS neg, Rub Immune, Hep B neg, RPR NR, HIV neg, GC/C neg, glucola:abnormal Hgb: 12.4 Blood type: O positive, antibody neg  Immunizations: Tdap: 1/25 Flu: 9/21  OBHx: FTNSVD- 2017- female uncomplicated PMHx:  GDM Meds:  PNV Allergy:  No Known Allergies SurgHx: none SocHx:   no Tobacco, non  EtOH, no Illicit Drugs  O: VS to be obtained Gen. AAOx3, NAD CV.  RRR  No murmur.  Resp. CTAB, no wheeze or crackles. Abd. Gravid,  no tenderness,  no rigidity,  no guarding Extr.  no edema B/L , no calf tenderness  FHT: 150 by doppler in office, reactive NST SVE: closed soft/-3, vertex  Labs: see orders  A/P:  37 y.o. G2P1001 @ 8023w1d EGA who presents for IOL due to GDM -FWB:  Reassuring by doppler -Labor: cytotec for cervical ripening -GBS: negative -GDMA1: accucheck on arrival -Pain management: IV upon request  Myna HidalgoJennifer Ithiel Liebler, DO [phone] (cell) [phone] (office)

## 2017-12-11 ENCOUNTER — Inpatient Hospital Stay (HOSPITAL_COMMUNITY)
Admission: RE | Admit: 2017-12-11 | Discharge: 2017-12-12 | DRG: 768 | Disposition: A | Discharge status: Home / Self Care | Payer: 59 | Source: Ambulatory Visit | Attending: Obstetrics & Gynecology | Admitting: Obstetrics & Gynecology

## 2017-12-11 ENCOUNTER — Encounter (HOSPITAL_COMMUNITY): Payer: Self-pay

## 2017-12-11 DIAGNOSIS — Z3A39 39 weeks gestation of pregnancy: Secondary | ICD-10-CM

## 2017-12-11 DIAGNOSIS — O24420 Gestational diabetes mellitus in childbirth, diet controlled: Principal | ICD-10-CM | POA: Diagnosis present

## 2017-12-11 DIAGNOSIS — O24429 Gestational diabetes mellitus in childbirth, unspecified control: Secondary | ICD-10-CM

## 2017-12-11 LAB — GLUCOSE, CAPILLARY
GLUCOSE-CAPILLARY: 118 mg/dL — AB (ref 65–99)
Glucose-Capillary: 85 mg/dL (ref 65–99)

## 2017-12-11 LAB — CBC
HCT: 36.7 % (ref 36.0–46.0)
Hemoglobin: 12.6 g/dL (ref 12.0–15.0)
MCH: 32.2 pg (ref 26.0–34.0)
MCHC: 34.3 g/dL (ref 30.0–36.0)
MCV: 93.9 fL (ref 78.0–100.0)
PLATELETS: 160 10*3/uL (ref 150–400)
RBC: 3.91 MIL/uL (ref 3.87–5.11)
RDW: 13 % (ref 11.5–15.5)
WBC: 7.7 10*3/uL (ref 4.0–10.5)

## 2017-12-11 LAB — TYPE AND SCREEN
ABO/RH(D): O POS
Antibody Screen: NEGATIVE

## 2017-12-11 LAB — RPR: RPR Ser Ql: NONREACTIVE

## 2017-12-11 LAB — ABO/RH: ABO/RH(D): O POS

## 2017-12-11 MED ORDER — PHENYLEPHRINE 40 MCG/ML (10ML) SYRINGE FOR IV PUSH (FOR BLOOD PRESSURE SUPPORT)
80.0000 ug | PREFILLED_SYRINGE | INTRAVENOUS | Status: DC | PRN
  Filled 2017-12-11: qty 5

## 2017-12-11 MED ORDER — OXYCODONE-ACETAMINOPHEN 5-325 MG PO TABS: 2.0000 | ORAL_TABLET | ORAL | Status: DC | PRN

## 2017-12-11 MED ORDER — ONDANSETRON HCL 4 MG/2ML IJ SOLN: 4.0000 mg | Freq: Four times a day (QID) | INTRAMUSCULAR | Status: DC | PRN

## 2017-12-11 MED ORDER — PRENATAL MULTIVITAMIN CH
1.0000 | ORAL_TABLET | Freq: Every day | ORAL | Status: DC
  Administered 2017-12-12: 1 via ORAL
  Filled 2017-12-11: qty 1

## 2017-12-11 MED ORDER — SIMETHICONE 80 MG PO CHEW
80.0000 mg | CHEWABLE_TABLET | ORAL | Status: DC | PRN
Start: 2017-12-11 — End: 2017-12-12

## 2017-12-11 MED ORDER — LACTATED RINGERS IV SOLN
500.0000 mL | INTRAVENOUS | Status: DC | PRN
  Administered 2017-12-11 (×2): 500 mL via INTRAVENOUS

## 2017-12-11 MED ORDER — WITCH HAZEL-GLYCERIN EX PADS: 1.0000 "application " | MEDICATED_PAD | CUTANEOUS | Status: DC | PRN

## 2017-12-11 MED ORDER — COCONUT OIL OIL: 1.0000 "application " | TOPICAL_OIL | Status: DC | PRN

## 2017-12-11 MED ORDER — DIBUCAINE 1 % RE OINT: 1.0000 "application " | TOPICAL_OINTMENT | RECTAL | Status: DC | PRN

## 2017-12-11 MED ORDER — ACETAMINOPHEN 325 MG PO TABS: 650.0000 mg | ORAL_TABLET | ORAL | Status: DC | PRN

## 2017-12-11 MED ORDER — ONDANSETRON HCL 4 MG PO TABS: 4.0000 mg | ORAL_TABLET | ORAL | Status: DC | PRN

## 2017-12-11 MED ORDER — IBUPROFEN 600 MG PO TABS
600.0000 mg | ORAL_TABLET | Freq: Four times a day (QID) | ORAL | Status: DC
  Administered 2017-12-11 – 2017-12-12 (×4): 600 mg via ORAL
  Filled 2017-12-11 (×5): qty 1

## 2017-12-11 MED ORDER — BENZOCAINE-MENTHOL 20-0.5 % EX AERO
1.0000 "application " | INHALATION_SPRAY | CUTANEOUS | Status: DC | PRN
  Administered 2017-12-11: 1 via TOPICAL
  Filled 2017-12-11: qty 56

## 2017-12-11 MED ORDER — OXYTOCIN BOLUS FROM INFUSION
500.0000 mL | Freq: Once | INTRAVENOUS | Status: AC
  Administered 2017-12-11: 500 mL via INTRAVENOUS

## 2017-12-11 MED ORDER — OXYCODONE HCL 5 MG PO TABS: 5.0000 mg | ORAL_TABLET | Freq: Four times a day (QID) | ORAL | Status: DC | PRN

## 2017-12-11 MED ORDER — CEFAZOLIN SODIUM-DEXTROSE 2-4 GM/100ML-% IV SOLN
2.0000 g | Freq: Once | INTRAVENOUS | Status: AC
  Administered 2017-12-11: 2 g via INTRAVENOUS
  Filled 2017-12-11: qty 100

## 2017-12-11 MED ORDER — OXYTOCIN 40 UNITS IN LACTATED RINGERS INFUSION - SIMPLE MED: 2.5000 [IU]/h | INTRAVENOUS | Status: DC

## 2017-12-11 MED ORDER — SENNOSIDES-DOCUSATE SODIUM 8.6-50 MG PO TABS
2.0000 | ORAL_TABLET | ORAL | Status: DC
  Administered 2017-12-12: 2 via ORAL
  Filled 2017-12-11 (×2): qty 2

## 2017-12-11 MED ORDER — TERBUTALINE SULFATE 1 MG/ML IJ SOLN
0.2500 mg | Freq: Once | INTRAMUSCULAR | Status: DC | PRN
  Filled 2017-12-11: qty 1

## 2017-12-11 MED ORDER — ZOLPIDEM TARTRATE 5 MG PO TABS
5.0000 mg | ORAL_TABLET | Freq: Every evening | ORAL | Status: DC | PRN
Start: 2017-12-11 — End: 2017-12-12

## 2017-12-11 MED ORDER — EPHEDRINE 5 MG/ML INJ
10.0000 mg | INTRAVENOUS | Status: DC | PRN
  Filled 2017-12-11: qty 2

## 2017-12-11 MED ORDER — DIPHENHYDRAMINE HCL 25 MG PO CAPS: 25.0000 mg | ORAL_CAPSULE | Freq: Four times a day (QID) | ORAL | Status: DC | PRN

## 2017-12-11 MED ORDER — ONDANSETRON HCL 4 MG/2ML IJ SOLN: 4.0000 mg | INTRAMUSCULAR | Status: DC | PRN

## 2017-12-11 MED ORDER — DIPHENHYDRAMINE HCL 50 MG/ML IJ SOLN: 12.5000 mg | INTRAMUSCULAR | Status: DC | PRN

## 2017-12-11 MED ORDER — BUTORPHANOL TARTRATE 1 MG/ML IJ SOLN
1.0000 mg | Freq: Once | INTRAMUSCULAR | Status: DC
  Filled 2017-12-11: qty 1

## 2017-12-11 MED ORDER — OXYTOCIN 40 UNITS IN LACTATED RINGERS INFUSION - SIMPLE MED
1.0000 m[IU]/min | INTRAVENOUS | Status: DC
  Filled 2017-12-11: qty 1000

## 2017-12-11 MED ORDER — SOD CITRATE-CITRIC ACID 500-334 MG/5ML PO SOLN: 30.0000 mL | ORAL | Status: DC | PRN

## 2017-12-11 MED ORDER — MISOPROSTOL 25 MCG QUARTER TABLET
25.0000 ug | ORAL_TABLET | ORAL | Status: DC
  Administered 2017-12-11 (×2): 25 ug via VAGINAL
  Filled 2017-12-11 (×8): qty 1

## 2017-12-11 MED ORDER — FENTANYL 2.5 MCG/ML BUPIVACAINE 1/10 % EPIDURAL INFUSION (WH - ANES): 14.0000 mL/h | INTRAMUSCULAR | Status: DC | PRN

## 2017-12-11 MED ORDER — LACTATED RINGERS IV SOLN: 500.0000 mL | Freq: Once | INTRAVENOUS | Status: DC

## 2017-12-11 MED ORDER — OXYCODONE-ACETAMINOPHEN 5-325 MG PO TABS
1.0000 | ORAL_TABLET | ORAL | Status: DC | PRN
  Administered 2017-12-11: 1 via ORAL
  Filled 2017-12-11: qty 1

## 2017-12-11 MED ORDER — LACTATED RINGERS IV SOLN
INTRAVENOUS | Status: DC
  Administered 2017-12-11: 125 mL/h via INTRAVENOUS
  Administered 2017-12-11 (×2): via INTRAVENOUS

## 2017-12-11 MED ORDER — BUTORPHANOL TARTRATE 2 MG/ML IJ SOLN
2.0000 mg | INTRAMUSCULAR | Status: DC | PRN
  Administered 2017-12-11: 2 mg via INTRAVENOUS
  Filled 2017-12-11: qty 2

## 2017-12-11 MED ORDER — LIDOCAINE HCL (PF) 1 % IJ SOLN
30.0000 mL | INTRAMUSCULAR | Status: DC | PRN
  Administered 2017-12-11: 30 mL via SUBCUTANEOUS
  Filled 2017-12-11: qty 30

## 2017-12-11 NOTE — Lactation Note (Signed)
This note was copied from a baby's chart. Lactation Consultation Note  Patient Name: Cassandra Peters ZOXWR'UToday's Date: 12/11/2017 Reason for consult: Initial assessment;infant term but < 6lbs  LC Visit:  G2P2 mother whose infant is now 129 hours old.  Infant is very sleepy and not showing feeding cues.  Mother breastfed first child for about 15 months.  Discussed with parents the LPI Feeding Policy ( explained that the baby is not a LPI but with the < 6 lb status infant can mimic same characteristics as a LPI.) Showed parents how to unwrap and awaken a sleepy baby.  Since it has been over 4 hours since baby fed LC present to assist with feeding.  Mother taught breast massage and hand expression prior to infant latch.  Able to obtain a few drops of colostrum.  Breast tissue compressible and nipples are intact.  First attempt to latch was on the right breast in the football hold using the tea cup hold.  Infant remained very sleepy and not willing to suckle.  Next attempt was made in the laid back position without success.  A third attempt was made in the cross cradle position and infant able to latch on and suck after multiple attempts.  Infant remains very sleepy and much stimulation needed to keep her awake.    Reviewed awakening infant at least every 3 hours and watching for feeding cues, proper positioning, breast massage, hand expression before and after feeding and to call RN for help as needed.  Mother needed to be reminded multiple times to place fingers back and away from nipple while infant was feeding.  She was taught how to hold and support baby for proper body alignment.  At this time it is not necessary to supplement, however, the parents were informed that if feedings were not adequate that may be a possibility.  Parents verbalized understanding.    Mom made aware of O/P services, breastfeeding support groups, community resources, and our phone # for post-discharge questions. Parents  signed interpreter consent form.  No further questions/concerns at this time.  Maternal Data Has patient been taught Hand Expression?: Yes Does the patient have breastfeeding experience prior to this delivery?: Yes  Feeding Feeding Type: Breast Fed Length of feed: 10 min(still feeding)  LATCH Score Latch: Repeated attempts needed to sustain latch, nipple held in mouth throughout feeding, stimulation needed to elicit sucking reflex.  Audible Swallowing: None  Type of Nipple: Everted at rest and after stimulation  Comfort (Breast/Nipple): Soft / non-tender  Hold (Positioning): Full assist, staff holds infant at breast  LATCH Score: 5  Interventions Interventions: Breast feeding basics reviewed;Assisted with latch;Skin to skin;Breast massage;Hand express;Position options;Support pillows;Adjust position;Breast compression  Lactation Tools Discussed/Used WIC Program: No   Consult Status Consult Status: Follow-up Date: 12/12/17 Follow-up type: In-patient    Denine Brotz R Markos Theil 12/11/2017, 11:17 PM

## 2017-12-11 NOTE — Anesthesia Pain Management Evaluation Note (Signed)
  CRNA Pain Management Visit Note  Patient: Cassandra Peters, 37 y.o., female  "Hello [facility]. We have an anesthesia team available at all times to provide care throughout the hospital, including epidural management and anesthesia for C-section. I don't know your plan for the delivery whether it a natural birth, water birth, IV sedation, nitrous supplementation, doula or epidural, but we want to meet your pain goals."   1.Was your pain managed to your expectations on prior hospitalizations?   Yes   2.What is your expectation for pain management during this hospitalization?     IV pain meds  3.How can we help you reach that goal? Be available  Record the patient's initial score and the patient's pain goal.   Pain: 3  Pain Goal: 6 [facility] wants you to be able to say your pain was always managed very well.  Edison PaceWILKERSON,Cassandra Peters 12/11/2017    Pt and husband declined an interpreter;husband translated and appeared they had a good relationship and were in agreement with IV meds for labor;educated on epidural option

## 2017-12-11 NOTE — Progress Notes (Signed)
OB PN:  S: Pt resting comfortably, rates pain 3/10  O: BP (!) 97/50   Pulse 65   Temp 97.9 F (36.6 C) (Oral)   Resp 18   Ht 5\' 4"  (1.626 m)   Wt 70.5 kg (155 lb 6.4 oz)   BMI 26.67 kg/m   FHT: 140bpm, moderate variablity, + accels, occasional  Variable decels Toco: q2-554min SVE: per RN fingertip/thick/post  A/P: 37 y.o. G2P1001 @ 2519w1d for IOL due to GDM 1. FWB: Cat. II- pt repositioned, overall FHT reassuring 2. Labor: cytotec #2 placed, plan to transition to Pit this am Pain: IV upon request GBS: neg GDMA1: accucheck 118 on arrival, []  recheck this am  Myna HidalgoJennifer Rozalyn Osland, DO [phone] (cell) [phone] (office)

## 2017-12-12 ENCOUNTER — Encounter (HOSPITAL_COMMUNITY): Payer: Self-pay | Admitting: *Deleted

## 2017-12-12 LAB — CBC
HCT: 31.8 % — ABNORMAL LOW (ref 36.0–46.0)
Hemoglobin: 10.8 g/dL — ABNORMAL LOW (ref 12.0–15.0)
MCH: 31.9 pg (ref 26.0–34.0)
MCHC: 34 g/dL (ref 30.0–36.0)
MCV: 93.8 fL (ref 78.0–100.0)
PLATELETS: 120 10*3/uL — AB (ref 150–400)
RBC: 3.39 MIL/uL — AB (ref 3.87–5.11)
RDW: 13.1 % (ref 11.5–15.5)
WBC: 9.7 10*3/uL (ref 4.0–10.5)

## 2017-12-12 MED ORDER — IBUPROFEN 600 MG PO TABS: 600.0000 mg | ORAL_TABLET | Freq: Four times a day (QID) | ORAL | 0 refills | Status: DC

## 2017-12-12 MED ORDER — SENNOSIDES-DOCUSATE SODIUM 8.6-50 MG PO TABS: 1.0000 | ORAL_TABLET | Freq: Two times a day (BID) | ORAL | 1 refills | Status: DC

## 2017-12-12 NOTE — Discharge Instructions (Signed)
Vaginal Delivery, Care After °Refer to this sheet in the next few weeks. These instructions provide you with information about caring for yourself after vaginal delivery. Your health care provider may also give you more specific instructions. Your treatment has been planned according to current medical practices, but problems sometimes occur. Call your health care provider if you have any problems or questions. °What can I expect after the procedure? °After vaginal delivery, it is common to have: °· Some bleeding from your vagina. °· Soreness in your abdomen, your vagina, and the area of skin between your vaginal opening and your anus (perineum). °· Pelvic cramps. °· Fatigue. ° °Follow these instructions at home: °Medicines °· Take over-the-counter and prescription medicines only as told by your health care provider. °· If you were prescribed an antibiotic medicine, take it as told by your health care provider. Do not stop taking the antibiotic until it is finished. °Driving ° °· Do not drive or operate heavy machinery while taking prescription pain medicine. °· Do not drive for 24 hours if you received a sedative. °Lifestyle °· Do not drink alcohol. This is especially important if you are breastfeeding or taking medicine to relieve pain. °· Do not use tobacco products, including cigarettes, chewing tobacco, or e-cigarettes. If you need help quitting, ask your health care provider. °Eating and drinking °· Drink at least 8 eight-ounce glasses of water every day unless you are told not to by your health care provider. If you choose to breastfeed your baby, you may need to drink more water than this. °· Eat high-fiber foods every day. These foods may help prevent or relieve constipation. High-fiber foods include: °? Whole grain cereals and breads. °? Brown rice. °? Beans. °? Fresh fruits and vegetables. °Activity °· Return to your normal activities as told by your health care provider. Ask your health care provider  what activities are safe for you. °· Rest as much as possible. Try to rest or take a nap when your baby is sleeping. °· Do not lift anything that is heavier than your baby or 10 lb (4.5 kg) until your health care provider says that it is safe. °· Talk with your health care provider about when you can engage in sexual activity. This may depend on your: °? Risk of infection. °? Rate of healing. °? Comfort and desire to engage in sexual activity. °Vaginal Care °· If you have an episiotomy or a vaginal tear, check the area every day for signs of infection. Check for: °? More redness, swelling, or pain. °? More fluid or blood. °? Warmth. °? Pus or a bad smell. °· Do not use tampons or douches until your health care provider says this is safe. °· Watch for any blood clots that may pass from your vagina. These may look like clumps of dark red, brown, or black discharge. °General instructions °· Keep your perineum clean and dry as told by your health care provider. °· Wear loose, comfortable clothing. °· Wipe from front to back when you use the toilet. °· Ask your health care provider if you can shower or take a bath. If you had an episiotomy or a perineal tear during labor and delivery, your health care provider may tell you not to take baths for a certain length of time. °· Wear a bra that supports your breasts and fits you well. °· If possible, have someone help you with household activities and help care for your baby for at least a few days after   you leave the hospital. °· Keep all follow-up visits for you and your baby as told by your health care provider. This is important. °Contact a health care provider if: °· You have: °? Vaginal discharge that has a bad smell. °? Difficulty urinating. °? Pain when urinating. °? A sudden increase or decrease in the frequency of your bowel movements. °? More redness, swelling, or pain around your episiotomy or vaginal tear. °? More fluid or blood coming from your episiotomy or  vaginal tear. °? Pus or a bad smell coming from your episiotomy or vaginal tear. °? A fever. °? A rash. °? Little or no interest in activities you used to enjoy. °? Questions about caring for yourself or your baby. °· Your episiotomy or vaginal tear feels warm to the touch. °· Your episiotomy or vaginal tear is separating or does not appear to be healing. °· Your breasts are painful, hard, or turn red. °· You feel unusually sad or worried. °· You feel nauseous or you vomit. °· You pass large blood clots from your vagina. If you pass a blood clot from your vagina, save it to show to your health care provider. Do not flush blood clots down the toilet without having your health care provider look at them. °· You urinate more than usual. °· You are dizzy or light-headed. °· You have not breastfed at all and you have not had a menstrual period for 12 weeks after delivery. °· You have stopped breastfeeding and you have not had a menstrual period for 12 weeks after you stopped breastfeeding. °Get help right away if: °· You have: °? Pain that does not go away or does not get better with medicine. °? Chest pain. °? Difficulty breathing. °? Blurred vision or spots in your vision. °? Thoughts about hurting yourself or your baby. °· You develop pain in your abdomen or in one of your legs. °· You develop a severe headache. °· You faint. °· You bleed from your vagina so much that you fill two sanitary pads in one hour. °This information is not intended to replace advice given to you by your health care provider. Make sure you discuss any questions you have with your health care provider. °Document Released: 08/23/2000 Document Revised: 02/07/2016 Document Reviewed: 09/10/2015 °Elsevier Interactive Patient Education © 2018 [employer] ° °

## 2017-12-12 NOTE — Progress Notes (Signed)
Parents declined the use of an interpreter. Mom with limited english and dad speaks full english. Cassandra Peters, Cassandra Gaffin E, RN

## 2017-12-12 NOTE — Discharge Summary (Signed)
OB Discharge Summary     Patient Name: Cassandra Peters DOB: 02/08/1981 MRN: [id number]  Date of admission: 12/11/2017 Delivering MD: Frederik PearEGELE, JULIE P   Date of discharge: 12/12/2017  Admitting diagnosis: INDUCTION Intrauterine pregnancy: 5527w2d     Secondary diagnosis:  Active Problems:   Labor and delivery, indication for care  Additional problems: GDMA1     Discharge diagnosis: Term Pregnancy Delivered and GDM A1                                                                                                Post partum procedures:n/a  Augmentation: Cytotec  Complications: None  Hospital course:  Induction of Labor With Vaginal Delivery   37 y.o. yo G2P1001 at 6427w2d was admitted to the hospital 12/11/2017 for induction of labor.  Indication for induction: A1 DM.  Patient had an uncomplicated labor course as follows: Membrane Rupture Time/Date: 12:18 PM ,12/11/2017   Intrapartum Procedures: Episiotomy: None [1]                                         Lacerations:  3rd degree [4]  Patient had delivery of a Viable infant.  Information for the patient's newborn:  Clydene Pughahri, Girl Jeneal [[id number]][[id number]]  Delivery Method: Vaginal, Vacuum (Extractor)(Filed from Delivery Summary)   12/11/2017  Details of delivery can be found in separate delivery note- pt had VAVD due to fetal bradycardia.  Patient had a routine postpartum course. Patient is discharged home 12/12/17.  Physical exam  Vitals:   12/11/17 1430 12/11/17 1530 12/11/17 1931 12/11/17 2147  BP: 108/62 (!) 97/49 (!) 95/59   Pulse: 81 68 60   Resp: 20 18    Temp: (!) 97.4 F (36.3 C) 98.6 F (37 C) 100.3 F (37.9 C) 98.4 F (36.9 C)  TempSrc: Oral Oral Oral Oral  Weight:      Height:       General: alert, cooperative and no distress  CV: RRR Lungs: CTAB Lochia: appropriate Uterine Fundus: firm, non-tender, below umbilicus Incision: N/A DVT Evaluation: No evidence of DVT seen on physical exam. Labs: Lab Results  Component Value  Date   WBC 7.7 12/11/2017   HGB 12.6 12/11/2017   HCT 36.7 12/11/2017   MCV 93.9 12/11/2017   PLT 160 12/11/2017   CMP Latest Ref Rng & Units 08/13/2016  Glucose 65 - 99 mg/dL 84  BUN 6 - 20 mg/dL 12  Creatinine 4.400.57 - 1.021.00 mg/dL 7.250.57  Sodium 366134 - 440144 mmol/L 145(H)  Potassium 3.5 - 5.2 mmol/L 4.1  Chloride 96 - 106 mmol/L 106  CO2 18 - 29 mmol/L 23  Calcium 8.7 - 10.2 mg/dL 9.5  Total Protein 6.0 - 8.5 g/dL 6.8  Total Bilirubin 0.0 - 1.2 mg/dL <3.4<0.2  Alkaline Phos 39 - 117 IU/L 71  AST 0 - 40 IU/L 17  ALT 0 - 32 IU/L 16    Discharge instruction: per After Visit Summary and "Baby and Me Booklet".  After visit meds:  Allergies  as of 12/12/2017   No Known Allergies     Medication List    TAKE these medications   ibuprofen 600 MG tablet Commonly known as:  ADVIL,MOTRIN Take 1 tablet (600 mg total) by mouth every 6 (six) hours.   multivitamin-prenatal 27-0.8 MG Tabs tablet Take 1 tablet by mouth daily at 12 noon.   senna-docusate 8.6-50 MG tablet Commonly known as:  Senokot-S Take 1 tablet by mouth 2 (two) times daily.       Diet: routine diet  Activity: Advance as tolerated. Pelvic rest for 6 weeks.   Outpatient follow up:2 weeks Follow up Appt:No future appointments. Follow up Visit:No follow-ups on file.  Postpartum contraception: Undecided  Newborn Data: Live born female  Birth Weight: 5 lb 13 oz (2635 g) APGAR: 5, 8  Newborn Delivery   Birth date/time:  12/11/2017 12:23:00 Delivery type:  Vaginal, Vacuum (Extractor)     Baby Feeding: Breast Disposition:home with mother   12/12/2017 Sharon Seller, DO

## 2017-12-13 ENCOUNTER — Ambulatory Visit: Payer: Self-pay

## 2017-12-13 NOTE — Lactation Note (Signed)
This note was copied from a baby's chart. Lactation Consultation Note  Patient Name: Cassandra Peters Cassandra Peters: 12/13/2017  FOB states he will interpret. Baby 46 hours old < 6 lbs and RN requested assistance w/ breastfeeding. Baby had been having difficulty sustaining latch. Baby latched in cradle hold on R breast upon entering with intermittent sucks and swallows. Mother hand expressed drops from L breast. Mother states baby has been primarily breastfeeding on R breast. Observed feeding with sucks and swallows. Set up DEBP.  Spoke with RN and she will reinforce post pumping and review spoon feeding. Discussed milk storage and cleaning.      Maternal Data    Feeding Feeding Type: Breast Fed  LATCH Score Latch: Repeated attempts needed to sustain latch, nipple held in mouth throughout feeding, stimulation needed to elicit sucking reflex.  Audible Swallowing: None  Type of Nipple: Everted at rest and after stimulation  Comfort (Breast/Nipple): Soft / non-tender  Hold (Positioning): Full assist, staff holds infant at breast  [facility]: 5  Interventions    Lactation Tools Discussed/Used     Consult Status      Cassandra Peters, Cassandra Peters 12/13/2017, 11:04 AM

## 2017-12-14 ENCOUNTER — Ambulatory Visit: Payer: Self-pay

## 2017-12-14 NOTE — Lactation Note (Signed)
This note was copied from a baby's chart. Lactation Consultation Note  Patient Name: Cassandra Peters ZOXWR'UToday's Date: 12/14/2017 Reason for consult: Follow-up assessment   Baby 70 hours old and < 6 lbs. Mother is breastfeeding and supplementing with her breastmilk after. She is pumping 30 ml +. Recommend mother continue to post pump 4-6 times per day for 10-20 min. Demonstrated how to use manual pump. Give baby back volume pumped at the next feeding. Reviewed milk storage.  Reviewed engorgement care and monitoring voids/stools.     Maternal Data    Feeding Feeding Type: Breast Milk Length of feed: 20 min  LATCH Score                   Interventions    Lactation Tools Discussed/Used     Consult Status Consult Status: Complete Date: 12/14/17    Dahlia ByesBerkelhammer, Jionni Helming [facility] 12/14/2017, 10:29 AM

## 2018-05-30 IMAGING — US US MFM FETAL BPP W/O NON-STRESS
1 series · 13 of 28 positions shown · non-contrast
Comparison: none

[Series 1: us mfm fetal bpp w/o non-stress · 29 acquisitions, 13 frames shown]
[im 2/29]
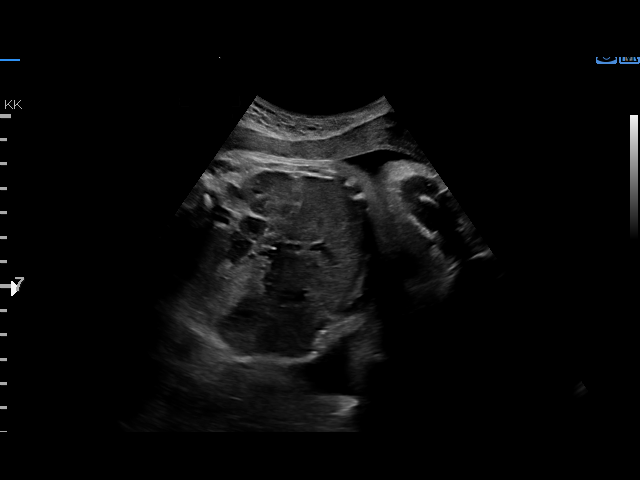
[im 4/29]
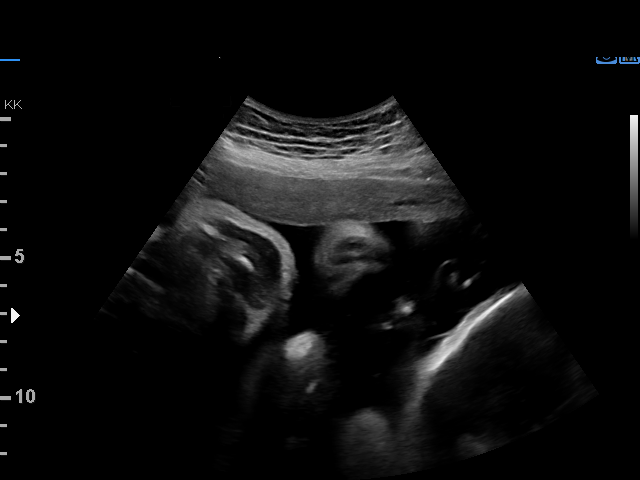
[im 6/29]
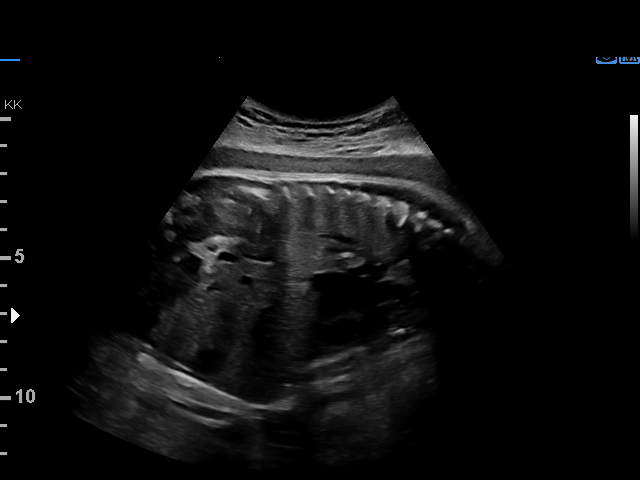
[im 8/29]
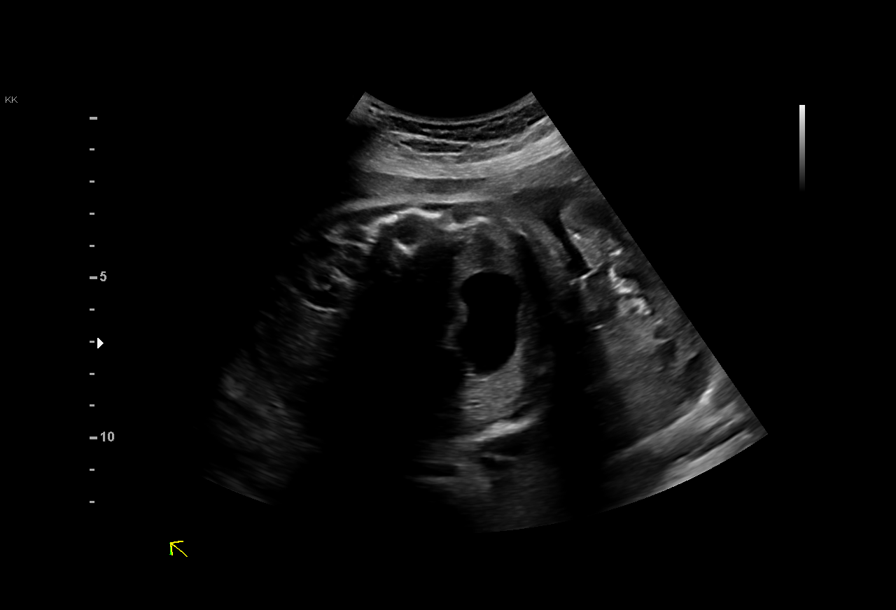
[im 10/29]
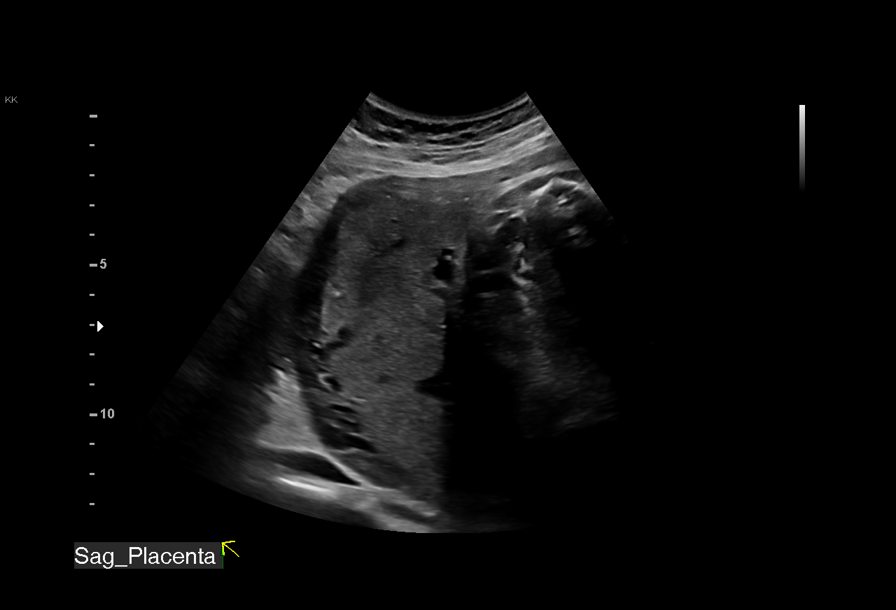
[im 12/29]
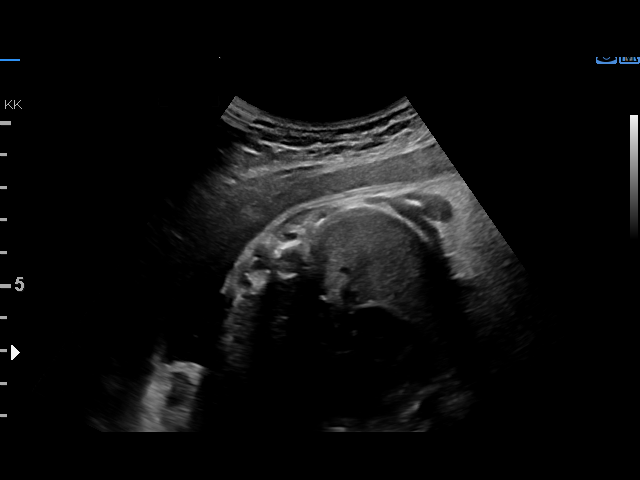
[im 15/29]
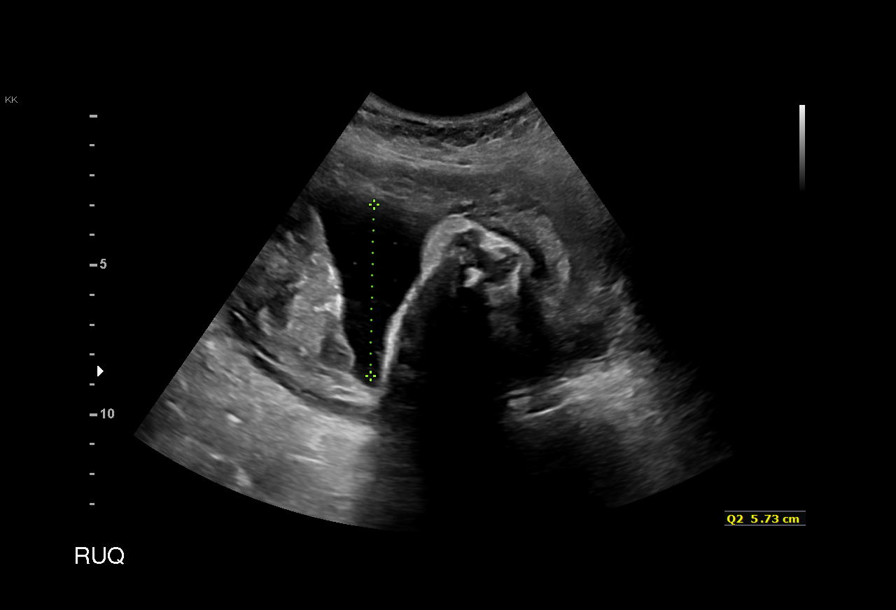
[im 17/29]
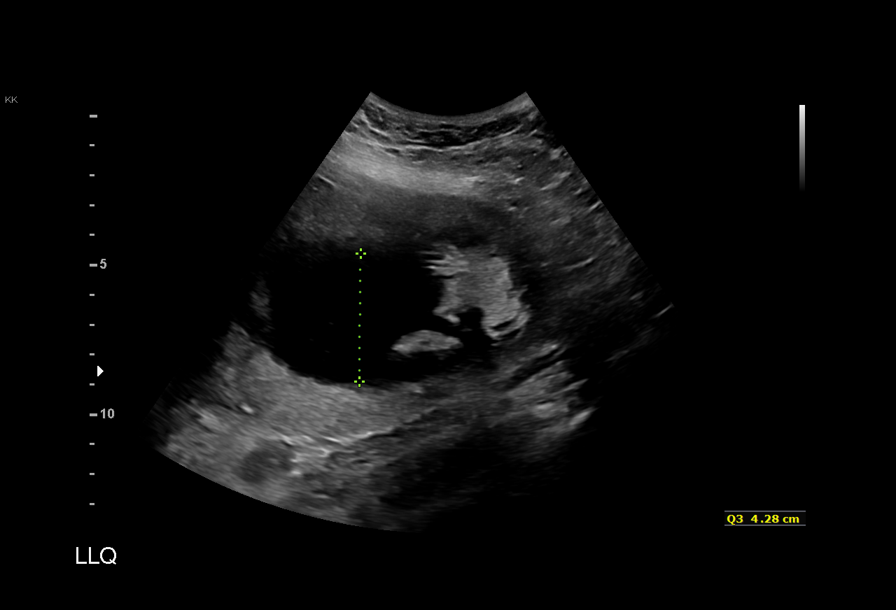
[im 19/29]
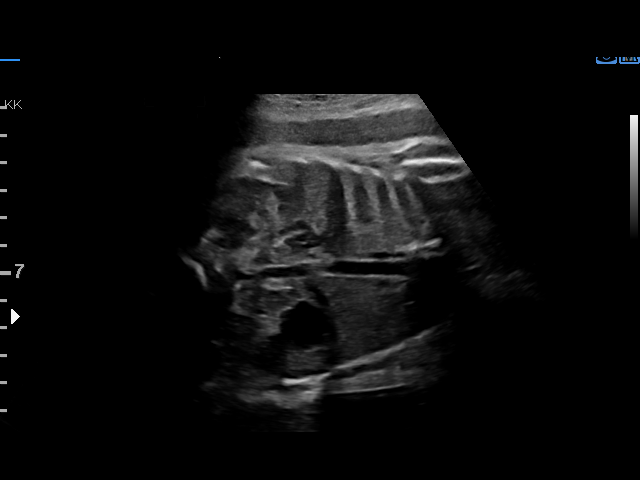
[im 21/29]
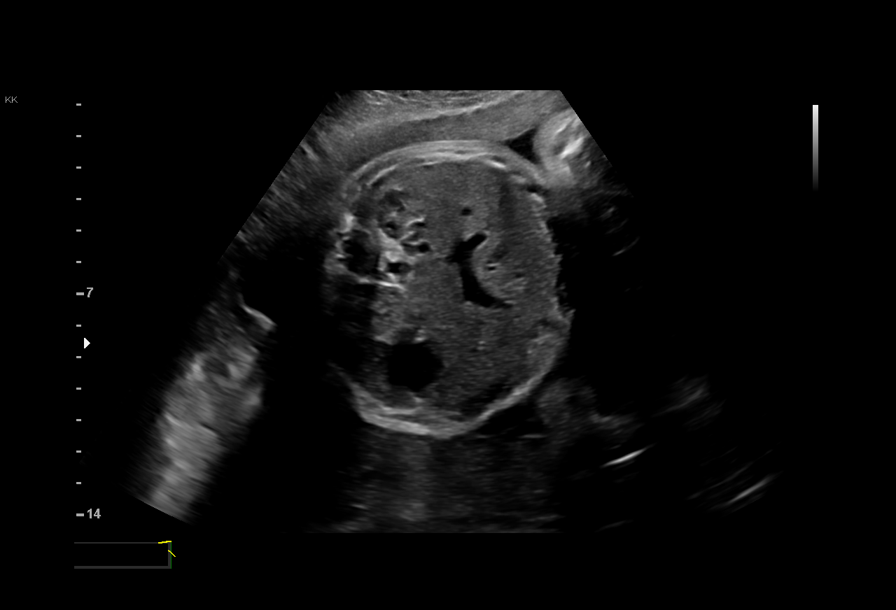
[im 23/29]
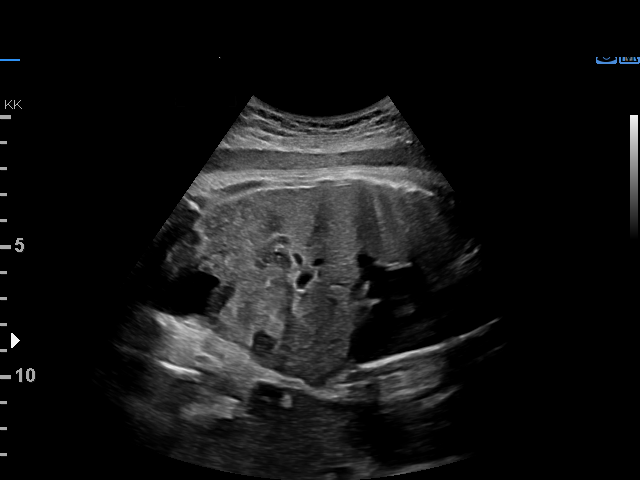
[im 25/29]
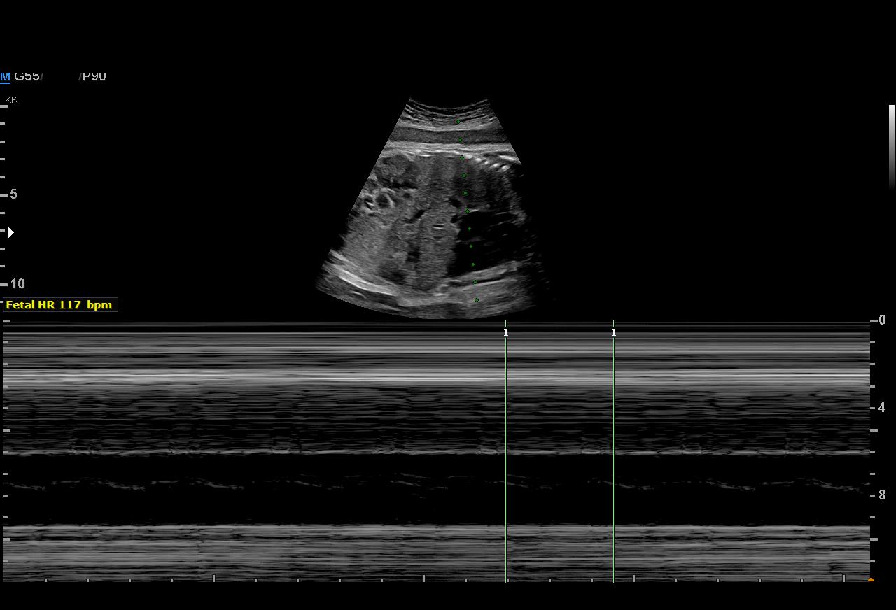
[im 27/29]
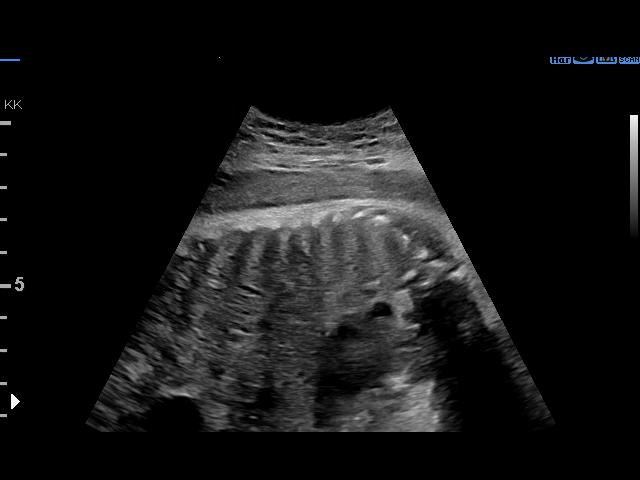

[13 of 28 positions shown; findings below may reference images not displayed]

Attending:        Che Wah Billie         Secondary Phy.:   OBISPO Nursing-
MAU/Triage
BARTHELUS NP

1  BETII NORIIEGA           [id number]      [phone]     [id number]
Indications

34 weeks gestation of pregnancy
Non-reactive NST, FHR decelerations
OB History

Blood Type:            Height:  5'3"   Weight (lb):  157       BMI:
Gravidity:    2         Term:   1
Living:       1
Fetal Evaluation

Num Of Fetuses:     1
Fetal Heart         136
Rate(bpm):
Cardiac Activity:   Observed
Presentation:       Cephalic

Amniotic Fluid
AFI FV:      Subjectively within normal limits

AFI Sum(cm)     %Tile       Largest Pocket(cm)
15.67           57

RUQ(cm)       RLQ(cm)       LUQ(cm)        LLQ(cm)
5.73
Biophysical Evaluation

Amniotic F.V:   Within normal limits       F. Tone:        Observed
F. Movement:    Observed                   Score:          [DATE]
F. Breathing:   Observed
Gestational Age

Clinical EDD:  34w 5d                                        EDD:   12/17/17
Best:          34w 5d     Det. By:  Clinical EDD             EDD:   12/17/17
Impression

Single living intrauterine pregnancy at 34w 5d.
Cephalic presentation.
Normal amniotic fluid volume with an AFI > 15cm
BPP [DATE].
Recommendations

BPP is not a reliable test in the case of late or severe variable
fetal heart rate decelerations
Follow-up ultrasounds as clinically indicated.

## 2019-09-10 NOTE — L&D Delivery Note (Signed)
Delivery Note At 5:01 PM a viable female was delivered via Vaginal, Spontaneous (Presentation: Left Occiput Anterior).  APGAR: 8, 8; weight pending  Placenta status: Spontaneous, Intact.  Cord: 3 vessels with the following complications: None.  Cord pH: n/a  Anesthesia: None Episiotomy: None Lacerations: 1st degree;Perineal Suture Repair: 3.0 vicryl Est. Blood Loss (mL):    Mom to postpartum.  Baby to Couplet care / Skin to Skin.  Sharon Seller 05/02/2020, 5:29 PM

## 2019-09-29 LAB — OB RESULTS CONSOLE HEPATITIS B SURFACE ANTIGEN: Hepatitis B Surface Ag: NEGATIVE

## 2019-09-29 LAB — OB RESULTS CONSOLE HIV ANTIBODY (ROUTINE TESTING): HIV: NONREACTIVE

## 2019-09-29 LAB — OB RESULTS CONSOLE RUBELLA ANTIBODY, IGM: Rubella: IMMUNE

## 2019-09-29 LAB — OB RESULTS CONSOLE ABO/RH: RH Type: POSITIVE

## 2019-09-29 LAB — OB RESULTS CONSOLE RPR: RPR: NONREACTIVE

## 2019-09-29 LAB — OB RESULTS CONSOLE ANTIBODY SCREEN: Antibody Screen: NEGATIVE

## 2020-04-18 ENCOUNTER — Encounter (HOSPITAL_COMMUNITY): Payer: Self-pay | Admitting: *Deleted

## 2020-04-18 ENCOUNTER — Telehealth (HOSPITAL_COMMUNITY): Payer: Self-pay | Admitting: *Deleted

## 2020-04-18 LAB — OB RESULTS CONSOLE GBS: GBS: NEGATIVE

## 2020-04-18 NOTE — Telephone Encounter (Signed)
Preadmission screen [phone] interpreter number

## 2020-04-19 ENCOUNTER — Telehealth (HOSPITAL_COMMUNITY): Payer: Self-pay | Admitting: *Deleted

## 2020-04-19 NOTE — Telephone Encounter (Signed)
Preadmission screen Interpreter [phone]

## 2020-05-01 ENCOUNTER — Other Ambulatory Visit (HOSPITAL_COMMUNITY)
Admission: RE | Admit: 2020-05-01 | Discharge: 2020-05-01 | Disposition: A | Discharge status: Home / Self Care | Payer: 59 | Source: Ambulatory Visit | Attending: Obstetrics & Gynecology | Admitting: Obstetrics & Gynecology

## 2020-05-01 DIAGNOSIS — Z20822 Contact with and (suspected) exposure to covid-19: Secondary | ICD-10-CM | POA: Insufficient documentation

## 2020-05-01 DIAGNOSIS — Z01812 Encounter for preprocedural laboratory examination: Secondary | ICD-10-CM | POA: Insufficient documentation

## 2020-05-01 LAB — SARS CORONAVIRUS 2 (TAT 6-24 HRS): SARS Coronavirus 2: NEGATIVE

## 2020-05-01 NOTE — H&P (Signed)
HPI: 39 y/o G3P2002 @ [redacted]w[redacted]d estimated gestational age (as dated by LMP c/w 20 week ultrasound) presents for scheduled IOL.   no Leaking of Fluid,   no Vaginal Bleeding,   no Uterine Contractions,  + Fetal Movement.  Prenatal care has been provided by Dr. Charlotta Newton  ROS: no HA, no epigastric pain, no visual changes.    Pregnancy complicated by: 1) AMA- normal anatomy scan, neg tetra 2) GDMA1- well controlled with diet -Last [place] @ 38w- vertex/pos/EFW 7# (52%)   Prenatal Transfer Tool  Maternal Diabetes: Yes:  Diabetes Type:  Diet controlled Genetic Screening: Normal Maternal Ultrasounds/Referrals: Normal Fetal Ultrasounds or other Referrals:  None Maternal Substance Abuse:  No Significant Maternal Medications:  None Significant Maternal Lab Results: Group B Strep negative   PNL:  GBS neg, Rub Immune, Hep B neg, RPR NR, HIV neg, GC/C neg, glucola:177, 3hr abnormal Hgb :13 Blood type: O positive, antibody neg  Immunizations: Tdap: 6/22 Flu: 1/20  OBHx:  2017- FTNSVD 2019- FT VAVD with 3rd deg (fetal bradycardia), 5#13oz, GDMA1 PMHx:  none Meds:  PNV Allergy:  No Known Allergies SurgHx: no SocHx:   no Tobacco, no  EtOH, no Illicit Drugs  O: There were no vitals taken for this visit. Gen. AAOx3, NAD CV.  RRR  No murmur. no Resp. CTAB, no wheeze or crackles. Abd. Gravid,  no tenderness,  no rigidity,  no guarding Extr.  no edema B/L , no calf tenderness, neg Homan's B/L FHT: 140 by doppler in office SVE: closed/soft/-3, vertex   Labs: see orders  A/P:  39 y.o. [id number] @ [redacted]w[redacted]d EGA who presents for IOL due to GDMA1 -FWB:  Reassuring by doppler -Labor: plan for cytotec per protocol -GBS: negative -Pain management: IV or epidural upon request  Myna Hidalgo, DO [phone] (cell) [phone] (office)

## 2020-05-02 ENCOUNTER — Encounter (HOSPITAL_COMMUNITY): Payer: Self-pay | Admitting: Obstetrics & Gynecology

## 2020-05-02 ENCOUNTER — Inpatient Hospital Stay (HOSPITAL_COMMUNITY): Payer: 59

## 2020-05-02 ENCOUNTER — Other Ambulatory Visit: Payer: Self-pay

## 2020-05-02 ENCOUNTER — Inpatient Hospital Stay (HOSPITAL_COMMUNITY)
Admission: AD | Admit: 2020-05-02 | Discharge: 2020-05-04 | DRG: 806 | Disposition: A | Discharge status: Home / Self Care | Payer: 59 | Attending: Obstetrics & Gynecology | Admitting: Obstetrics & Gynecology

## 2020-05-02 DIAGNOSIS — Z3A39 39 weeks gestation of pregnancy: Secondary | ICD-10-CM | POA: Diagnosis not present

## 2020-05-02 DIAGNOSIS — O24420 Gestational diabetes mellitus in childbirth, diet controlled: Secondary | ICD-10-CM | POA: Diagnosis present

## 2020-05-02 DIAGNOSIS — Z20822 Contact with and (suspected) exposure to covid-19: Secondary | ICD-10-CM | POA: Diagnosis present

## 2020-05-02 DIAGNOSIS — O24410 Gestational diabetes mellitus in pregnancy, diet controlled: Secondary | ICD-10-CM

## 2020-05-02 DIAGNOSIS — O24419 Gestational diabetes mellitus in pregnancy, unspecified control: Secondary | ICD-10-CM | POA: Diagnosis present

## 2020-05-02 LAB — CBC
HCT: 37.6 % (ref 36.0–46.0)
Hemoglobin: 12.4 g/dL (ref 12.0–15.0)
MCH: 31.9 pg (ref 26.0–34.0)
MCHC: 33 g/dL (ref 30.0–36.0)
MCV: 96.7 fL (ref 80.0–100.0)
Platelets: 145 10*3/uL — ABNORMAL LOW (ref 150–400)
RBC: 3.89 MIL/uL (ref 3.87–5.11)
RDW: 12.7 % (ref 11.5–15.5)
WBC: 8 10*3/uL (ref 4.0–10.5)
nRBC: 0 % (ref 0.0–0.2)

## 2020-05-02 LAB — GLUCOSE, CAPILLARY
Glucose-Capillary: 124 mg/dL — ABNORMAL HIGH (ref 70–99)
Glucose-Capillary: 90 mg/dL (ref 70–99)
Glucose-Capillary: 98 mg/dL (ref 70–99)

## 2020-05-02 LAB — TYPE AND SCREEN
ABO/RH(D): O POS
Antibody Screen: NEGATIVE

## 2020-05-02 LAB — RPR: RPR Ser Ql: NONREACTIVE

## 2020-05-02 MED ORDER — TERBUTALINE SULFATE 1 MG/ML IJ SOLN: 0.2500 mg | Freq: Once | INTRAMUSCULAR | Status: DC | PRN

## 2020-05-02 MED ORDER — METHYLERGONOVINE MALEATE 0.2 MG PO TABS
0.2000 mg | ORAL_TABLET | Freq: Once | ORAL | Status: AC
  Administered 2020-05-03: 0.2 mg via ORAL
  Filled 2020-05-02: qty 1

## 2020-05-02 MED ORDER — DIBUCAINE (PERIANAL) 1 % EX OINT: 1.0000 "application " | TOPICAL_OINTMENT | CUTANEOUS | Status: DC | PRN

## 2020-05-02 MED ORDER — LACTATED RINGERS IV SOLN: 500.0000 mL | INTRAVENOUS | Status: DC | PRN

## 2020-05-02 MED ORDER — BENZOCAINE-MENTHOL 20-0.5 % EX AERO: 1.0000 "application " | INHALATION_SPRAY | CUTANEOUS | Status: DC | PRN

## 2020-05-02 MED ORDER — METHYLERGONOVINE MALEATE 0.2 MG/ML IJ SOLN: 0.2000 mg | Freq: Once | INTRAMUSCULAR | Status: DC

## 2020-05-02 MED ORDER — SOD CITRATE-CITRIC ACID 500-334 MG/5ML PO SOLN: 30.0000 mL | ORAL | Status: DC | PRN

## 2020-05-02 MED ORDER — SENNOSIDES-DOCUSATE SODIUM 8.6-50 MG PO TABS
2.0000 | ORAL_TABLET | ORAL | Status: DC
  Administered 2020-05-02 – 2020-05-03 (×2): 2 via ORAL
  Filled 2020-05-02 (×2): qty 2

## 2020-05-02 MED ORDER — MISOPROSTOL 25 MCG QUARTER TABLET
25.0000 ug | ORAL_TABLET | ORAL | Status: DC | PRN
  Administered 2020-05-02 (×2): 25 ug via VAGINAL
  Filled 2020-05-02 (×2): qty 1

## 2020-05-02 MED ORDER — LACTATED RINGERS IV SOLN: INTRAVENOUS | Status: DC

## 2020-05-02 MED ORDER — FENTANYL CITRATE (PF) 100 MCG/2ML IJ SOLN
50.0000 ug | INTRAMUSCULAR | Status: DC | PRN
  Administered 2020-05-02: 100 ug via INTRAVENOUS
  Filled 2020-05-02: qty 2

## 2020-05-02 MED ORDER — OXYTOCIN BOLUS FROM INFUSION
333.0000 mL | Freq: Once | INTRAVENOUS | Status: AC
  Administered 2020-05-02: 17:00:00 333 mL via INTRAVENOUS

## 2020-05-02 MED ORDER — TRANEXAMIC ACID-NACL 1000-0.7 MG/100ML-% IV SOLN: 1000.0000 mg | Freq: Once | INTRAVENOUS | Status: AC

## 2020-05-02 MED ORDER — WITCH HAZEL-GLYCERIN EX PADS: 1.0000 "application " | MEDICATED_PAD | CUTANEOUS | Status: DC | PRN

## 2020-05-02 MED ORDER — ONDANSETRON HCL 4 MG/2ML IJ SOLN: 4.0000 mg | INTRAMUSCULAR | Status: DC | PRN

## 2020-05-02 MED ORDER — COCONUT OIL OIL: 1.0000 "application " | TOPICAL_OIL | Status: DC | PRN

## 2020-05-02 MED ORDER — METHYLERGONOVINE MALEATE 0.2 MG/ML IJ SOLN
INTRAMUSCULAR | Status: AC
  Filled 2020-05-02: qty 1

## 2020-05-02 MED ORDER — METHYLERGONOVINE MALEATE 0.2 MG/ML IJ SOLN
0.2000 mg | Freq: Four times a day (QID) | INTRAMUSCULAR | Status: DC
  Administered 2020-05-02: 0.2 mg via INTRAMUSCULAR

## 2020-05-02 MED ORDER — ZOLPIDEM TARTRATE 5 MG PO TABS: 5.0000 mg | ORAL_TABLET | Freq: Every evening | ORAL | Status: DC | PRN

## 2020-05-02 MED ORDER — OXYTOCIN-SODIUM CHLORIDE 30-0.9 UT/500ML-% IV SOLN: 2.5000 [IU]/h | INTRAVENOUS | Status: DC

## 2020-05-02 MED ORDER — IBUPROFEN 600 MG PO TABS
600.0000 mg | ORAL_TABLET | Freq: Four times a day (QID) | ORAL | Status: DC
  Administered 2020-05-02 – 2020-05-04 (×6): 600 mg via ORAL
  Filled 2020-05-02 (×6): qty 1

## 2020-05-02 MED ORDER — DIPHENHYDRAMINE HCL 25 MG PO CAPS: 25.0000 mg | ORAL_CAPSULE | Freq: Four times a day (QID) | ORAL | Status: DC | PRN

## 2020-05-02 MED ORDER — OXYTOCIN-SODIUM CHLORIDE 30-0.9 UT/500ML-% IV SOLN
1.0000 m[IU]/min | INTRAVENOUS | Status: DC
  Administered 2020-05-02: 2 m[IU]/min via INTRAVENOUS
  Filled 2020-05-02: qty 500

## 2020-05-02 MED ORDER — OXYCODONE-ACETAMINOPHEN 5-325 MG PO TABS: 2.0000 | ORAL_TABLET | ORAL | Status: DC | PRN

## 2020-05-02 MED ORDER — SIMETHICONE 80 MG PO CHEW: 80.0000 mg | CHEWABLE_TABLET | ORAL | Status: DC | PRN

## 2020-05-02 MED ORDER — ACETAMINOPHEN 325 MG PO TABS: 650.0000 mg | ORAL_TABLET | ORAL | Status: DC | PRN

## 2020-05-02 MED ORDER — LIDOCAINE HCL (PF) 1 % IJ SOLN
30.0000 mL | INTRAMUSCULAR | Status: AC | PRN
  Administered 2020-05-02: 30 mL via SUBCUTANEOUS
  Filled 2020-05-02: qty 30

## 2020-05-02 MED ORDER — ONDANSETRON HCL 4 MG PO TABS: 4.0000 mg | ORAL_TABLET | ORAL | Status: DC | PRN

## 2020-05-02 MED ORDER — PRENATAL MULTIVITAMIN CH
1.0000 | ORAL_TABLET | Freq: Every day | ORAL | Status: DC
  Administered 2020-05-03: 1 via ORAL
  Filled 2020-05-02: qty 1

## 2020-05-02 MED ORDER — TRANEXAMIC ACID-NACL 1000-0.7 MG/100ML-% IV SOLN
INTRAVENOUS | Status: AC
  Administered 2020-05-02: 1000 mg via INTRAVENOUS
  Filled 2020-05-02: qty 100

## 2020-05-02 MED ORDER — OXYCODONE-ACETAMINOPHEN 5-325 MG PO TABS: 1.0000 | ORAL_TABLET | ORAL | Status: DC | PRN

## 2020-05-02 MED ORDER — ONDANSETRON HCL 4 MG/2ML IJ SOLN: 4.0000 mg | Freq: Four times a day (QID) | INTRAMUSCULAR | Status: DC | PRN

## 2020-05-02 NOTE — Progress Notes (Signed)
OB PN:  S: Mild contractions noted  O: BP 97/68   Pulse 83   Temp 97.9 F (36.6 C) (Oral)   Resp 18   Ht 5\' 4"  (1.626 m)   Wt 74.1 kg   BMI 28.03 kg/m   FHT: 130bpm, moderate variablity, + accels, no decels Toco: q12min SVE: 2/25/-3, Cook balloon placed  Results for orders placed or performed during the hospital encounter of 05/02/20 (from the past 24 hour(s))  Glucose, capillary     Status: Abnormal   Collection Time: 05/02/20 12:44 AM  Result Value Ref Range   Glucose-Capillary 124 (H) 70 - 99 mg/dL  Type and screen [facility]     Status: None   Collection Time: 05/02/20 12:52 AM  Result Value Ref Range   ABO/RH(D) O POS    Antibody Screen NEG    Sample Expiration      05/05/2020,2359 Performed at [facility], [address], [place], [place]   CBC     Status: Abnormal   Collection Time: 05/02/20  1:00 AM  Result Value Ref Range   WBC 8.0 4.0 - 10.5 K/uL   RBC 3.89 3.87 - 5.11 MIL/uL   Hemoglobin 12.4 12.0 - 15.0 g/dL   HCT 05/04/20 36 - 46 %   MCV 96.7 80.0 - 100.0 fL   MCH 31.9 26.0 - 34.0 pg   MCHC 33.0 30.0 - 36.0 g/dL   RDW 91.5 05.6 - 97.9 %   Platelets 145 (L) 150 - 400 K/uL   nRBC 0.0 0.0 - 0.2 %  RPR     Status: None   Collection Time: 05/02/20  1:00 AM  Result Value Ref Range   RPR Ser Ql NON REACTIVE NON REACTIVE  Glucose, capillary     Status: None   Collection Time: 05/02/20  5:12 AM  Result Value Ref Range   Glucose-Capillary 90 70 - 99 mg/dL  Glucose, capillary     Status: None   Collection Time: 05/02/20  9:23 AM  Result Value Ref Range   Glucose-Capillary 98 70 - 99 mg/dL     A/P: 39 y.o. 30 @ [redacted]w[redacted]d for IOL GDMA1 1. FWB: Cat. I 2. Labor: continue Pit per protocol, Cook balloon placed Pain: IV or epidural upon request GBS: negative GDMA1: accucheck as above, will check as clinically indicated  [redacted]w[redacted]d, DO [phone] (cell) [phone] (office)

## 2020-05-02 NOTE — Progress Notes (Signed)
Patient with boggy uterus and clots with fundal massage on admission. VS stable. L&D RN called Dr. Charlotta Newton and obtained an order for methergine IM q6 hours for 24 hours. Methergine given and patient remained boggy with small amount of bleeding with no clots however there was a steady trickle. Dr. Charlotta Newton was notified again to update. Order given for TXA. [facility], CNM came to evaluate patient and patient was firm, VS stable, with minimal bleeding. IM methergine PO discontinued per CNM and new order for PO methergine x1 in 6 hours. Will pass on and continue to monitor patient. Royston Cowper, RN

## 2020-05-02 NOTE — Progress Notes (Signed)
OB PN:  S: Pt resting comfortably, starting to feel mild contractions, no acute complaints  O: BP 100/64   Pulse 81   Temp 98 F (36.7 C) (Oral)   Ht 5\' 4"  (1.626 m)   Wt 74.1 kg   BMI 28.03 kg/m   FHT: 130bpm, moderate variablity, + accels, no decels Toco: irregular SVE: deferred  Results for orders placed or performed during the hospital encounter of 05/02/20 (from the past 24 hour(s))  Type and screen [facility]     Status: None   Collection Time: 05/02/20 12:52 AM  Result Value Ref Range   ABO/RH(D) O POS    Antibody Screen NEG    Sample Expiration      05/05/2020,2359 Performed at [facility], [address], [place], [place]   CBC     Status: Abnormal   Collection Time: 05/02/20  1:00 AM  Result Value Ref Range   WBC 8.0 4.0 - 10.5 K/uL   RBC 3.89 3.87 - 5.11 MIL/uL   Hemoglobin 12.4 12.0 - 15.0 g/dL   HCT 05/04/20 36 - 46 %   MCV 96.7 80.0 - 100.0 fL   MCH 31.9 26.0 - 34.0 pg   MCHC 33.0 30.0 - 36.0 g/dL   RDW 28.7 68.1 - 15.7 %   Platelets 145 (L) 150 - 400 K/uL   nRBC 0.0 0.0 - 0.2 %     A/P: 39 y.o. G3P2002 @ [redacted]w[redacted]d for IOL GDMA1 1. FWB: Cat. I 2. Labor: s/p cytotec # 2 placed, plan to transition to Pitocin Pain: IV or epidural upon request GBS: negative GDMA1: accucheck 85, will check as clinically indicated  [redacted]w[redacted]d, DO [phone] (cell) [phone] (office)

## 2020-05-03 LAB — CBC
HCT: 34.5 % — ABNORMAL LOW (ref 36.0–46.0)
Hemoglobin: 11.7 g/dL — ABNORMAL LOW (ref 12.0–15.0)
MCH: 32.3 pg (ref 26.0–34.0)
MCHC: 33.9 g/dL (ref 30.0–36.0)
MCV: 95.3 fL (ref 80.0–100.0)
Platelets: 112 10*3/uL — ABNORMAL LOW (ref 150–400)
RBC: 3.62 MIL/uL — ABNORMAL LOW (ref 3.87–5.11)
RDW: 12.4 % (ref 11.5–15.5)
WBC: 11.6 10*3/uL — ABNORMAL HIGH (ref 4.0–10.5)
nRBC: 0 % (ref 0.0–0.2)

## 2020-05-03 NOTE — Lactation Note (Addendum)
This note was copied from a baby's chart. Lactation Consultation Note  Patient Name: Boy Piccola Arico ZOXWR'U Date: 05/03/2020   Video interpreter used for Arabic.  FOB also did some interpretation.  P3, Baby 17 hours old.  Mother Ex BF for 13 mos. W/ first child and did not breastfeed her 2nd child. She would really like to breastfeed this child and is concerned about her milk supply. Reviewed hand expression w/ drops expressed. Discussed supply and demand. Parents did not know to check line on diaper for void and to look inside diaper. Provided education. Recommend mother call for LC to view latch. Feed on demand with cues.  Goal 8-12+ times per day after first 24 hrs.  Place baby STS if not cueing.  Provided lactation brochure with phone number for questions.   Returned to room to view latch.  Mother had baby latched fully wrapped. Suggest unwrapping baby for feedings to wake and reviewed waking techniques. Encouraged breast compression with "C" hold versus scissor hold.  Mother reverted back to scissor hold. Suggest offering both breasts per feeding. Reviewed hand pump use.  Recommend hand expression and spoon feeding infant with each feeding.      Maternal Data    Feeding    LATCH Score                   Interventions    Lactation Tools Discussed/Used     Consult Status      Dahlia Byes [facility] 05/03/2020, 10:05 AM

## 2020-05-03 NOTE — Progress Notes (Signed)
Postpartum Note Day # 1  S:  Patient resting comfortable in bed.  Pain controlled.  Tolerating general diet. + flatus, no BM.  Lochia moderate.  Ambulating without difficulty.  She denies n/v/f/c, SOB, or CP.  Pt plans on breastfeeding though states it's been slow going.  Pt requesting early discharge home  O: Temp:  [97.8 F (36.6 C)-98.7 F (37.1 C)] 98.5 F (36.9 C) (08/25 0355) Pulse Rate:  [63-109] 63 (08/25 0355) Resp:  [16-20] 18 (08/25 0355) BP: (89-140)/(58-84) 97/59 (08/25 0355) SpO2:  [98 %-100 %] 98 % (08/25 0355)   Gen: A&Ox3, NAD CV: RRR Resp: CTAB Abdomen: soft, NT, ND Uterus: firm, non-tender, at umbilicus Ext: No edema, no calf tenderness bilaterally, SCDs in place  Labs: CBC pending this am  A/P: Pt is a 39 y.o. [id number] s/p NSVD, PPD#1 -Uterine atony- s/p methergine x 2 and TXA- VSS, lochia appropriate overnight, pt asymptomatic, CBC pending this am - Pain well controlled -GU: voiding freely this am -GI: Tolerating general diet -Activity: encouraged sitting up to chair and ambulation as tolerated -Prophylaxis: early ambulation  DISPO: Pt requesting early discharge later this afternoon/evening- will consider if CBC stable, she remains asymptomatic and ok with PEDS  Myna Hidalgo, DO [phone] (cell) [phone] (office)

## 2020-05-04 ENCOUNTER — Encounter (HOSPITAL_COMMUNITY): Payer: Self-pay

## 2020-05-04 MED ORDER — IBUPROFEN 600 MG PO TABS: 600.0000 mg | ORAL_TABLET | Freq: Four times a day (QID) | ORAL | 0 refills | Status: DC | PRN

## 2020-05-04 NOTE — Discharge Summary (Signed)
Postpartum Discharge Summary    Patient Name: Cassandra Peters DOB: 09/09/1981 MRN: [id number]  Date of admission: 05/02/2020 Delivery date:05/02/2020  Delivering provider: Janyth Pupa  Date of discharge: 05/04/2020  Admitting diagnosis: Gestational diabetes mellitus [O24.419] Intrauterine pregnancy: [redacted]w[redacted]d     Secondary diagnosis:  Active Problems:   Gestational diabetes mellitus  Additional problems: none    Discharge diagnosis: Term Pregnancy Delivered and GDM A1                                              Post partum procedures:none Augmentation: Pitocin, Cytotec and IP Foley Complications: None  Hospital course: Induction of Labor With Vaginal Delivery   39 y.o. yo G3P2002 at [redacted]w[redacted]d was admitted to the hospital 05/02/2020 for induction of labor.  Indication for induction: A1 DM.  Patient had an uncomplicated labor course as follows: Membrane Rupture Time/Date: 5:00 PM ,05/02/2020   Delivery Method:Vaginal, Spontaneous  Episiotomy: None  Lacerations:  1st degree;Perineal  Details of delivery can be found in separate delivery note.  Patient had a routine postpartum course. Patient is discharged home 05/04/20.  Newborn Data: Birth date:05/02/2020  Birth time:5:01 PM  Gender:Female  Living status:Living  Apgars:8 ,8  Weight:3099 g   Magnesium Sulfate received: No BMZ received: No Rhophylac:No MMR:No T-DaP:Given prenatally Flu: N/A Transfusion:No  Physical exam  Vitals:   05/03/20 0355 05/03/20 1324 05/03/20 2309 05/04/20 0615  BP: (!) 97/59 112/68 96/71 107/70  Pulse: 63 79 90 79  Resp: $Remo'18 18 18 18  'SvBxb$ Temp: 98.5 F (36.9 C) 98.4 F (36.9 C) 98.1 F (36.7 C) 97.7 F (36.5 C)  TempSrc: Oral Oral Oral Oral  SpO2: 98%  100% 99%  Weight:      Height:       General: alert, cooperative and no distress Lochia: appropriate Uterine Fundus: firm Incision: N/A DVT Evaluation: No evidence of DVT seen on physical exam. Labs: Lab Results  Component Value Date   WBC 11.6  (H) 05/03/2020   HGB 11.7 (L) 05/03/2020   HCT 34.5 (L) 05/03/2020   MCV 95.3 05/03/2020   PLT 112 (L) 05/03/2020   CMP Latest Ref Rng & Units 08/13/2016  Glucose 65 - 99 mg/dL 84  BUN 6 - 20 mg/dL 12  Creatinine 0.57 - 1.00 mg/dL 0.57  Sodium 134 - 144 mmol/L 145(H)  Potassium 3.5 - 5.2 mmol/L 4.1  Chloride 96 - 106 mmol/L 106  CO2 18 - 29 mmol/L 23  Calcium 8.7 - 10.2 mg/dL 9.5  Total Protein 6.0 - 8.5 g/dL 6.8  Total Bilirubin 0.0 - 1.2 mg/dL <0.2  Alkaline Phos 39 - 117 IU/L 71  AST 0 - 40 IU/L 17  ALT 0 - 32 IU/L 16   Edinburgh Score: Edinburgh Postnatal Depression Scale Screening Tool 05/03/2020  I have been able to laugh and see the funny side of things. 0  I have looked forward with enjoyment to things. 0  I have blamed myself unnecessarily when things went wrong. 0  I have been anxious or worried for no good reason. 0  I have felt scared or panicky for no good reason. 0  Things have been getting on top of me. 1  I have been so unhappy that I have had difficulty sleeping. 0  I have felt sad or miserable. 0  I have been so unhappy that I  have been crying. 0  The thought of harming myself has occurred to me. 0  Edinburgh Postnatal Depression Scale Total 1      After visit meds:  Allergies as of 05/04/2020   No Known Allergies     Medication List    STOP taking these medications   senna-docusate 8.6-50 MG tablet Commonly known as: Senokot-S     TAKE these medications   ibuprofen 600 MG tablet Commonly known as: ADVIL Take 1 tablet (600 mg total) by mouth every 6 (six) hours as needed for mild pain, moderate pain or cramping. What changed:   when to take this  reasons to take this   [place] 1 application topically as needed (pain).   multivitamin-prenatal 27-0.8 MG Tabs tablet Take 1 tablet by mouth daily at 12 noon.        Discharge home in stable condition Infant Feeding: Breast Infant Disposition:home with mother Discharge  instruction: per After Visit Summary and Postpartum booklet. Activity: Advance as tolerated. Pelvic rest for 6 weeks.  Diet: routine diet Anticipated Birth Control: Unsure Postpartum Appointment:6 weeks Additional Postpartum F/U: 2 hour GTT Future Appointments:No future appointments. Follow up Visit:      05/04/2020 Annalee Genta, DO

## 2020-05-04 NOTE — Lactation Note (Signed)
This note was copied from a baby's chart. Lactation Consultation Note  Patient Name: Cassandra Peters PVXYI'A Date: 05/04/2020 Reason for consult: Follow-up assessment   Video interpreter used for Arabic. P3, Baby 39 hours old.  Mother states baby was feeding frequently so she started supplementing with formula early this morning. Encouraged mother to breastfeed on demand and occasionally post pump for 10 min to stimulate her supply. Feed on demand with cues.  Goal 8-12+ times per day after first 24 hrs.  Place baby STS if not cueing.  Reviewed engorgement care and monitoring voids/stools.   Maternal Data    Feeding Feeding Type: Breast Fed  LATCH Score                   Interventions Interventions: Breast feeding basics reviewed;Hand pump  Lactation Tools Discussed/Used     Consult Status Consult Status: Complete Date: 05/04/20    Dahlia Byes [facility] 05/04/2020, 8:11 AM

## 2020-10-04 ENCOUNTER — Other Ambulatory Visit: Payer: Self-pay

## 2020-10-04 ENCOUNTER — Ambulatory Visit
Admission: RE | Admit: 2020-10-04 | Discharge: 2020-10-04 | Disposition: A | Discharge status: Home / Self Care | Payer: 59 | Source: Ambulatory Visit | Attending: Urgent Care | Admitting: Urgent Care

## 2020-10-04 VITALS — BP 117/77 | HR 87 | Temp 98.7°F | Resp 18

## 2020-10-04 DIAGNOSIS — K219 Gastro-esophageal reflux disease without esophagitis: Secondary | ICD-10-CM

## 2020-10-04 DIAGNOSIS — R1013 Epigastric pain: Secondary | ICD-10-CM | POA: Diagnosis not present

## 2020-10-04 LAB — POCT URINALYSIS DIP (MANUAL ENTRY)
Bilirubin, UA: NEGATIVE
Blood, UA: NEGATIVE
Glucose, UA: NEGATIVE mg/dL
Ketones, POC UA: NEGATIVE mg/dL
Nitrite, UA: NEGATIVE
Protein Ur, POC: NEGATIVE mg/dL
Spec Grav, UA: 1.025 (ref 1.010–1.025)
Urobilinogen, UA: 0.2 E.U./dL
pH, UA: 6 (ref 5.0–8.0)

## 2020-10-04 LAB — POCT URINE PREGNANCY: Preg Test, Ur: NEGATIVE

## 2020-10-04 MED ORDER — OMEPRAZOLE 20 MG PO CPDR: 20.0000 mg | DELAYED_RELEASE_CAPSULE | Freq: Two times a day (BID) | ORAL | 0 refills | Status: DC

## 2020-10-04 NOTE — ED Triage Notes (Signed)
Pt here for upper abd pain x 2 months worse over last week; pt sts worse after eating; pt sts some pepto bismol did improve; pt is 70months post partum

## 2020-10-04 NOTE — ED Provider Notes (Signed)
[facility]   MRN: [id number] DOB: 08/18/1981  Subjective:   Cassandra Peters is a 40 y.o. female presenting for 2 month history of progressive onset upper abdominal pain after she eats. Denies fever, chest pain, shob, n/v, diarrhea, constipation. Patient is 5 months post-partum. Has used Pepto with some relief. Has also had gas.   No current facility-administered medications for this encounter.  Current Outpatient Medications:  .  ibuprofen (ADVIL) 600 MG tablet, Take 1 tablet (600 mg total) by mouth every 6 (six) hours as needed for mild pain, moderate pain or cramping., Disp: 30 tablet, Rfl: 0 .  Menthol, Topical Analgesic, (ICY HOT BACK EX), Apply 1 application topically as needed (pain)., Disp: , Rfl:  .  Prenatal Vit-Fe Fumarate-FA (MULTIVITAMIN-PRENATAL) 27-0.8 MG TABS tablet, Take 1 tablet by mouth daily at 12 noon., Disp: , Rfl:    No Known Allergies  Past Medical History:  Diagnosis Date  . Gestational diabetes      Past Surgical History:  Procedure Laterality Date  . NO PAST SURGERIES      Family History  Problem Relation Age of Onset  . Diabetes Mother   . Hypertension Mother     Social History   Tobacco Use  . Smoking status: Never Smoker  . Smokeless tobacco: Never Used  Vaping Use  . Vaping Use: Never used  Substance Use Topics  . Alcohol use: No  . Drug use: No    ROS   Objective:   Vitals: BP 117/77 (BP Location: Right Arm)   Pulse 87   Temp 98.7 F (37.1 C) (Oral)   Resp 18   SpO2 95%   Physical Exam Constitutional:      General: She is not in acute distress.    Appearance: Normal appearance. She is well-developed and normal weight. She is not ill-appearing, toxic-appearing or diaphoretic.  HENT:     Head: Normocephalic and atraumatic.     Right Ear: External ear normal.     Left Ear: External ear normal.     Nose: Nose normal.     Mouth/Throat:     Mouth: Mucous membranes are moist.     Pharynx: Oropharynx is clear.   Eyes:     General: No scleral icterus.    Extraocular Movements: Extraocular movements intact.     Pupils: Pupils are equal, round, and reactive to light.  Cardiovascular:     Rate and Rhythm: Normal rate and regular rhythm.     Pulses: Normal pulses.     Heart sounds: Normal heart sounds. No murmur heard. No friction rub. No gallop.   Pulmonary:     Effort: Pulmonary effort is normal. No respiratory distress.     Breath sounds: Normal breath sounds. No stridor. No wheezing, rhonchi or rales.  Abdominal:     General: Bowel sounds are normal. There is no distension.     Palpations: Abdomen is soft. There is no mass.     Tenderness: There is generalized abdominal tenderness (mild) and tenderness in the epigastric area. There is no right CVA tenderness, left CVA tenderness, guarding or rebound.  Skin:    General: Skin is warm and dry.     Coloration: Skin is not pale.     Findings: No rash.  Neurological:     General: No focal deficit present.     Mental Status: She is alert and oriented to person, place, and time.  Psychiatric:        Mood and Affect: Mood  normal.        Behavior: Behavior normal.        Thought Content: Thought content normal.        Judgment: Judgment normal.     Results for orders placed or performed during the hospital encounter of 10/04/20 (from the past 24 hour(s))  POCT urinalysis dipstick     Status: Abnormal   Collection Time: 10/04/20 10:41 AM  Result Value Ref Range   Color, UA yellow yellow   Clarity, UA clear clear   Glucose, UA negative negative mg/dL   Bilirubin, UA negative negative   Ketones, POC UA negative negative mg/dL   Spec Grav, UA 1.610 9.604 - 1.025   Blood, UA negative negative   pH, UA 6.0 5.0 - 8.0   Protein Ur, POC negative negative mg/dL   Urobilinogen, UA 0.2 0.2 or 1.0 E.U./dL   Nitrite, UA Negative Negative   Leukocytes, UA Trace (A) Negative  POCT urine pregnancy     Status: None   Collection Time: 10/04/20 10:42 AM   Result Value Ref Range   Preg Test, Ur Negative Negative    Assessment and Plan :   PDMP not reviewed this encounter.  1. Abdominal pain, epigastric   2. Gastroesophageal reflux disease, unspecified whether esophagitis present     Recommend start Prilosec, avoid GERD causing foods. Counseled patient on potential for adverse effects with medications prescribed/recommended today, ER and return-to-clinic precautions discussed, patient verbalized understanding.    Wallis Bamberg, PA-C 10/04/20 1059

## 2020-11-08 ENCOUNTER — Other Ambulatory Visit: Payer: Self-pay | Admitting: Physician Assistant

## 2020-11-08 DIAGNOSIS — R112 Nausea with vomiting, unspecified: Secondary | ICD-10-CM

## 2020-11-08 DIAGNOSIS — R1013 Epigastric pain: Secondary | ICD-10-CM

## 2020-11-22 ENCOUNTER — Ambulatory Visit
Admission: RE | Admit: 2020-11-22 | Discharge: 2020-11-22 | Disposition: A | Discharge status: Home / Self Care | Payer: 59 | Source: Ambulatory Visit | Attending: Physician Assistant | Admitting: Physician Assistant

## 2020-11-22 DIAGNOSIS — R112 Nausea with vomiting, unspecified: Secondary | ICD-10-CM

## 2020-11-22 DIAGNOSIS — R1013 Epigastric pain: Secondary | ICD-10-CM

## 2020-11-28 ENCOUNTER — Other Ambulatory Visit: Payer: Self-pay | Admitting: Surgery

## 2021-03-09 DIAGNOSIS — U071 COVID-19: Secondary | ICD-10-CM

## 2021-03-09 HISTORY — DX: COVID-19: U07.1

## 2021-03-23 ENCOUNTER — Emergency Department (HOSPITAL_COMMUNITY)
Admission: EM | Admit: 2021-03-23 | Discharge: 2021-03-23 | Disposition: A | Discharge status: Home / Self Care | Payer: 59 | Attending: Emergency Medicine | Admitting: Emergency Medicine

## 2021-03-23 ENCOUNTER — Encounter (HOSPITAL_COMMUNITY): Payer: Self-pay

## 2021-03-23 ENCOUNTER — Other Ambulatory Visit: Payer: Self-pay

## 2021-03-23 DIAGNOSIS — M7918 Myalgia, other site: Secondary | ICD-10-CM | POA: Insufficient documentation

## 2021-03-23 DIAGNOSIS — Z20822 Contact with and (suspected) exposure to covid-19: Secondary | ICD-10-CM | POA: Insufficient documentation

## 2021-03-23 DIAGNOSIS — R52 Pain, unspecified: Secondary | ICD-10-CM

## 2021-03-23 DIAGNOSIS — R509 Fever, unspecified: Secondary | ICD-10-CM | POA: Insufficient documentation

## 2021-03-23 LAB — I-STAT BETA HCG BLOOD, ED (MC, WL, AP ONLY): I-stat hCG, quantitative: <5 m[IU]/mL (ref <5)

## 2021-03-23 MED ORDER — ACETAMINOPHEN 325 MG PO TABS
650.0000 mg | ORAL_TABLET | Freq: Once | ORAL | Status: AC
  Administered 2021-03-23: 650 mg via ORAL
  Filled 2021-03-23: qty 2

## 2021-03-23 NOTE — ED Triage Notes (Signed)
Per EMS- Patient has had Covid Exposure x 4 days. Patient has a 45 month old that is Covid +.  Patient c/o muscle aches, fever, and headache.

## 2021-03-23 NOTE — ED Notes (Signed)
Patient waiting on ride.  

## 2021-03-23 NOTE — Discharge Instructions (Addendum)
Pregnancy test is negative.  You can buy over the counter Debrox medicine to help with your ear wax build up.  Thank you for allowing [place] to care for you today.   Please return to the emergency department if you have any new or worsening symptoms.  Covid is a virus and antibiotics are not needed typically.  Medications- You can take medications to help treat your symptoms: -Tylenol for fever and body aches. Please take as prescribed on the bottle. -Over the coutner cough medicine such as mucinex, robitussin, or other brands. -Flonase or saline nasal spray for nasal congestion -Vitamins as recommended by CDC: vitamin C, D and Zinc  Treatment- This is a virus and unfortunately there are no antibitotics approved to treat this virus at this time. It is important to monitor your symptoms closely: -You should have a theremometer at home to check your temperature when feeling feverish. -Use a pulse ox meter to measure your oxygen when feeling short of breath.  -If your fever is over 100.4 despite taking tylenol or if your oxygen level drops below 92% these are reasons to return to the emergency department for further evaluation.   -CDC quarantine guidelines are asking you to isolate for 5 days then -You can return to work, school or normal activities if on day 6 you are fever free without the use of Tylenol or ibuprofen. You need to wear a mask for the next 5 days. -If you are still having fever or sever symptoms on day 5 you need to continue isolation until day 10.     Again: symptoms of shortness of breath, chest pain, difficulty breathing, new onset of confusion, any symptoms that are concerning. If any of these symptoms you should come to emergency department for evaluation.   I hope you feel better soon

## 2021-03-23 NOTE — ED Provider Notes (Signed)
[facility]-EMERGENCY DEPT Provider Note   CSN: [id number] Arrival date & time: 03/23/21  1514     History Chief Complaint  Patient presents with   Covid Positive    Cassandra Peters is a 40 y.o. female with past history significant for gestational diabetes.  HPI Patient presents to emergency department today via EMS with chief complaint of COVID exposure.  Patient said she had a positive home COVID test x4 days ago.  Her 62-month old daughter is also COVID-positive.  Patient is reporting generalized muscle aches, fever, and headache.  Patient states her symptoms have been intermittent, denies any headache currently.    She admits to history of headaches and states they feel similar.  She denies any fall or head injury.  Patient states she has been staying well-hydrated and drinking plenty of water at home.     Due to language barrier, an audio interpreter was present during the history-taking and subsequent discussion (and for part of the physical exam) with this patient.   Past Medical History:  Diagnosis Date   Gestational diabetes     Patient Active Problem List   Diagnosis Date Noted   Gestational diabetes mellitus 05/02/2020   Labor and delivery, indication for care 12/11/2017    Past Surgical History:  Procedure Laterality Date   NO PAST SURGERIES       OB History     Gravida  3   Para  2   Term  2   Preterm  0   AB  0   Living  2      SAB  0   IAB  0   Ectopic  0   Multiple  0   Live Births  2           Family History  Problem Relation Age of Onset   Diabetes Mother    Hypertension Mother     Social History   Tobacco Use   Smoking status: Never   Smokeless tobacco: Never  Vaping Use   Vaping Use: Never used  Substance Use Topics   Alcohol use: No   Drug use: No    Home Medications Prior to Admission medications   Medication Sig Start Date End Date Taking? Authorizing Provider  ibuprofen (ADVIL) 600  MG tablet Take 1 tablet (600 mg total) by mouth every 6 (six) hours as needed for mild pain, moderate pain or cramping. 05/04/20   Myna Hidalgo, DO  Menthol, Topical Analgesic, (ICY HOT BACK EX) Apply 1 application topically as needed (pain).    [provider]  omeprazole (PRILOSEC) 20 MG capsule Take 1 capsule (20 mg total) by mouth 2 (two) times daily before a meal. 10/04/20   Wallis Bamberg, PA-C  Prenatal Vit-Fe Fumarate-FA (MULTIVITAMIN-PRENATAL) 27-0.8 MG TABS tablet Take 1 tablet by mouth daily at 12 noon.    [provider]    Allergies    Patient has no known allergies.  Review of Systems   Review of Systems All other systems are reviewed and are negative for acute change except as noted in the HPI.  Physical Exam Updated Vital Signs BP 107/76 (BP Location: Left Arm)   Pulse 90   Temp 98.6 F (37 C) (Oral)   Resp 16   Ht 5\' 7"  (1.702 m)   Wt 70.8 kg   LMP 03/23/2021 (Exact Date)   SpO2 100%   BMI 24.43 kg/m   Physical Exam Vitals and nursing note reviewed.  Constitutional:  General: She is not in acute distress.    Appearance: She is not ill-appearing.  HENT:     Head: Normocephalic and atraumatic.     Comments: No tenderness to palpation of skull. No deformities or crepitus noted. No open wounds, abrasions or lacerations.      Right Ear: Tympanic membrane and external ear normal.     Left Ear: Tympanic membrane and external ear normal.     Nose: Nose normal.     Mouth/Throat:     Mouth: Mucous membranes are moist.     Pharynx: Oropharynx is clear.  Eyes:     General: No scleral icterus.       Right eye: No discharge.        Left eye: No discharge.     Extraocular Movements: Extraocular movements intact.     Conjunctiva/sclera: Conjunctivae normal.     Pupils: Pupils are equal, round, and reactive to light.  Neck:     Vascular: No JVD.     Comments: Full ROM intact without spinous process TTP. No bony stepoffs or deformities, no  paraspinous muscle TTP or muscle spasms. No rigidity or meningeal signs. No bruising, erythema, or swelling.   Cardiovascular:     Rate and Rhythm: Normal rate and regular rhythm.     Pulses: Normal pulses.          Radial pulses are 2+ on the right side and 2+ on the left side.     Heart sounds: Normal heart sounds.  Pulmonary:     Comments: Lungs clear to auscultation in all fields. Symmetric chest rise. No wheezing, rales, or rhonchi. Abdominal:     Comments: Abdomen is soft, non-distended, and non-tender in all quadrants. No rigidity, no guarding. No peritoneal signs.  Musculoskeletal:        General: Normal range of motion.     Cervical back: Normal range of motion.  Skin:    General: Skin is warm and dry.     Capillary Refill: Capillary refill takes less than 2 seconds.  Neurological:     Mental Status: She is oriented to person, place, and time.     GCS: GCS eye subscore is 4. GCS verbal subscore is 5. GCS motor subscore is 6.     Comments: Speech is clear and goal oriented, follows commands CN III-XII intact, no facial droop Normal strength in upper and lower extremities bilaterally including dorsiflexion and plantar flexion, strong and equal grip strength Sensation normal to light and sharp touch Moves extremities without ataxia, coordination intact Normal finger to nose and rapid alternating movements Normal gait and balance    Psychiatric:        Behavior: Behavior normal.    ED Results / Procedures / Treatments   Labs (all labs ordered are listed, but only abnormal results are displayed) Labs Reviewed  I-STAT BETA HCG BLOOD, ED ([facility], [facility], [facility] ONLY)    EKG None  Radiology No results found.  Procedures Procedures   Medications Ordered in ED Medications  acetaminophen (TYLENOL) tablet 650 mg (650 mg Oral Given 03/23/21 1603)    ED Course  I have reviewed the triage vital signs and the nursing notes.  Pertinent labs & imaging results that were available  during my care of the patient were reviewed by me and considered in my medical decision making (see chart for details).    MDM Rules/Calculators/A&P  History provided by patient with additional history obtained from chart review.    Patient with home positive COVID test presenting for generalized body aches and headache with subjective fever.  Patient is afebrile here, hemodynamically stable.  She is very well-appearing and is denying any headache currently.  She has a normal neuro exam.  No meningeal signs or red flags for headache.  Pregnancy test here is negative.  No indications for further emergent work-up at this time.  Did offer patient IV fluids however she feels she is well-hydrated and making plenty of water at home.  She clinically does not look dehydrated.  Discussed symptomatic home care for COVID illness.  Strict return precautions discussed.  Patient agreeable with plan of care.  Portions of this note were generated with Scientist, clinical (histocompatibility and immunogenetics). Dictation errors may occur despite best attempts at proofreading.   Final Clinical Impression(s) / ED Diagnoses Final diagnoses:  Generalized body aches    Rx / DC Orders ED Discharge Orders     None        Kandice Hams 03/23/21 1632    Tilden Fossa, MD 03/26/21 [phone]

## 2021-05-09 ENCOUNTER — Encounter (HOSPITAL_COMMUNITY): Payer: Self-pay

## 2021-05-09 NOTE — Patient Instructions (Addendum)
DUE TO COVID-19 ONLY ONE VISITOR IS ALLOWED TO COME WITH YOU AND STAY IN THE WAITING ROOM ONLY DURING PRE OP AND PROCEDURE.   **NO VISITORS ARE ALLOWED IN THE SHORT STAY AREA OR RECOVERY ROOM!!**    Your procedure is scheduled on:  Tuesday, 05-15-21   Report to [facility] Main  Entrance   Report to admitting at 9:45 AM   Call this number if you have problems the morning of surgery [phone]   Do not eat food :After Midnight.   May have liquids until 9:00 AM day of surgery  CLEAR LIQUID DIET  Foods Allowed                                                                     Foods Excluded  Water, Black Coffee (no milk/no creamer) and tea, regular and decaf                              liquids that you cannot  Plain Jell-O in any flavor  (No red)                         see through such as: Fruit ices (not with fruit pulp)                                 milk, soups, orange juice  Iced Popsicles (No red)                                    All solid food                             Apple juices Sports drinks like Gatorade (No red) Lightly seasoned clear broth or consume(fat free) Sugar  Drink one Pre-Surgical Ensure the morning of surgery by 9:00 AM       Oral Hygiene is also important to reduce your risk of infection.                                    Remember - BRUSH YOUR TEETH THE MORNING OF SURGERY WITH YOUR REGULAR TOOTHPASTE   Do NOT smoke after Midnight   Take these medicines the morning of surgery with A SIP OF WATER:  Omeprazole, Prenatal vitamins             You may not have any metal on your body including hair pins, jewelry, and body piercing             Do not wear make-up, lotions, powders, perfumes or deodorant  Do not wear nail polish including gel and S&S, artificial/acrylic nails, or any other type of covering on natural nails including finger and toenails. If you have artificial nails, gel coating, etc. that needs to be removed by a nail salon  please have this removed prior to surgery or surgery may need to be canceled/ delayed if the surgeon/  anesthesia feels like they are unable to be safely monitored.   Do not shave  48 hours prior to surgery.   Do not bring valuables to the hospital. [facility] IS NOT RESPONSIBLE FOR VALUABLES.   Contacts, dentures or bridgework may not be worn into surgery.   Patients discharged the day of surgery will not be allowed to drive home.  Please read over the following fact sheets you were given: IF YOU HAVE QUESTIONS ABOUT YOUR PRE OP INSTRUCTIONS PLEASE CALL [phone] [facility] - Preparing for Surgery Before surgery, you can play an important role.  Because skin is not sterile, your skin needs to be as free of germs as possible.  You can reduce the number of germs on your skin by washing with CHG (chlorahexidine gluconate) soap before surgery.  CHG is an antiseptic cleaner which kills germs and bonds with the skin to continue killing germs even after washing. Please DO NOT use if you have an allergy to CHG or antibacterial soaps.  If your skin becomes reddened/irritated stop using the CHG and inform your nurse when you arrive at Short Stay. Do not shave (including legs and underarms) for at least 48 hours prior to the first CHG shower.  You may shave your face/neck.  Please follow these instructions carefully:  1.  Shower with CHG Soap the night before surgery and the  morning of surgery.  2.  If you choose to wash your hair, wash your hair first as usual with your normal  shampoo.  3.  After you shampoo, rinse your hair and body thoroughly to remove the shampoo.                             4.  Use CHG as you would any other liquid soap.  You can apply chg directly to the skin and wash.  Gently with a scrungie or clean washcloth.  5.  Apply the CHG Soap to your body ONLY FROM THE NECK DOWN.   Do   not use on face/ open                           Wound or open sores. Avoid contact with  eyes, ears mouth and   genitals (private parts).                       Wash face,  Genitals (private parts) with your normal soap.             6.  Wash thoroughly, paying special attention to the area where your    surgery  will be performed.  7.  Thoroughly rinse your body with warm water from the neck down.  8.  DO NOT shower/wash with your normal soap after using and rinsing off the CHG Soap.                9.  Pat yourself dry with a clean towel.            10.  Wear clean pajamas.            11.  Place clean sheets on your bed the night of your first shower and do not  sleep with pets. Day of Surgery : Do not apply any lotions/deodorants the morning of surgery.  Please wear clean clothes to the hospital/surgery center.  FAILURE TO FOLLOW  THESE INSTRUCTIONS MAY RESULT IN THE CANCELLATION OF YOUR SURGERY  PATIENT SIGNATURE_________________________________  NURSE SIGNATURE__________________________________  ________________________________________________________________________

## 2021-05-10 ENCOUNTER — Encounter (HOSPITAL_COMMUNITY): Payer: Self-pay

## 2021-05-10 ENCOUNTER — Other Ambulatory Visit: Payer: Self-pay

## 2021-05-10 ENCOUNTER — Encounter (HOSPITAL_COMMUNITY)
Admission: RE | Admit: 2021-05-10 | Discharge: 2021-05-10 | Disposition: A | Discharge status: Home / Self Care | Payer: 59 | Source: Ambulatory Visit | Attending: General Surgery | Admitting: General Surgery

## 2021-05-10 ENCOUNTER — Ambulatory Visit: Payer: Self-pay | Admitting: General Surgery

## 2021-05-10 DIAGNOSIS — Z01812 Encounter for preprocedural laboratory examination: Secondary | ICD-10-CM | POA: Diagnosis not present

## 2021-05-10 HISTORY — DX: Prediabetes: R73.03

## 2021-05-10 HISTORY — DX: Dyspnea, unspecified: R06.00

## 2021-05-10 HISTORY — DX: Gastro-esophageal reflux disease without esophagitis: K21.9

## 2021-05-10 LAB — CBC
HCT: 40.8 % (ref 36.0–46.0)
Hemoglobin: 13.5 g/dL (ref 12.0–15.0)
MCH: 30.3 pg (ref 26.0–34.0)
MCHC: 33.1 g/dL (ref 30.0–36.0)
MCV: 91.5 fL (ref 80.0–100.0)
Platelets: 191 10*3/uL (ref 150–400)
RBC: 4.46 MIL/uL (ref 3.87–5.11)
RDW: 12.7 % (ref 11.5–15.5)
WBC: 6.1 10*3/uL (ref 4.0–10.5)
nRBC: 0 % (ref 0.0–0.2)

## 2021-05-10 LAB — BASIC METABOLIC PANEL WITH GFR
Anion gap: 7 (ref 5–15)
BUN: 11 mg/dL (ref 6–20)
CO2: 24 mmol/L (ref 22–32)
Calcium: 9.4 mg/dL (ref 8.9–10.3)
Chloride: 107 mmol/L (ref 98–111)
Creatinine, Ser: 0.61 mg/dL (ref 0.44–1.00)
GFR, Estimated: >60 mL/min
Glucose, Bld: 98 mg/dL (ref 70–99)
Potassium: 3.8 mmol/L (ref 3.5–5.1)
Sodium: 138 mmol/L (ref 135–145)

## 2021-05-10 LAB — HEMOGLOBIN A1C
Hgb A1c MFr Bld: 5.9 % — ABNORMAL HIGH (ref 4.8–5.6)
Mean Plasma Glucose: 122.63 mg/dL

## 2021-05-10 NOTE — Progress Notes (Addendum)
COVID swab appointment:N/A  COVID Vaccine Completed:  Yes x2 Date COVID Vaccine completed: 09-13-20 10-04-20 Has received booster: COVID vaccine manufacturer: Pfizer       Date of COVID positive in last 90 days: Yes 7/22 by home test, seen in the ER  PCP - Unsure of name Cardiologist - N/A  Chest x-ray - N/A EKG - N/A Stress Test - N/A ECHO - N/A Cardiac Cath - N/A Pacemaker/ICD device last checked: Spinal Cord Stimulator:  Sleep Study - N/A CPAP -   Fasting Blood Sugar - Patient has history of gestational diabetes and is now prediabetic.  No longer checks CBG at home Checks Blood Sugar _____ times a day  Blood Thinner Instructions: N/A Aspirin Instructions: Last Dose:  Activity level:  Can go up a flight of stairs and perform activities of daily living without stopping and without symptoms of chest pain or shortness of breath.  Patient states that she did have shortness of breath when she had Covid in July 2022 but that has resolved.    Anesthesia review:  N/A  Patient denies shortness of breath, fever, cough and chest pain at PAT appointment.     Patient verbalized understanding of instructions that were given to them at the PAT appointment. Patient was also instructed that they will need to review over the PAT instructions again at home before surgery.

## 2021-05-15 ENCOUNTER — Encounter (HOSPITAL_COMMUNITY)
Admission: RE | Disposition: A | Discharge status: Home / Self Care | Payer: Self-pay | Source: Ambulatory Visit | Attending: General Surgery

## 2021-05-15 ENCOUNTER — Encounter (HOSPITAL_COMMUNITY): Payer: Self-pay | Admitting: General Surgery

## 2021-05-15 ENCOUNTER — Ambulatory Visit (HOSPITAL_COMMUNITY): Payer: 59 | Admitting: Anesthesiology

## 2021-05-15 ENCOUNTER — Ambulatory Visit (HOSPITAL_COMMUNITY)
Admission: RE | Admit: 2021-05-15 | Discharge: 2021-05-15 | Disposition: A | Discharge status: Home / Self Care | Payer: 59 | Source: Ambulatory Visit | Attending: General Surgery | Admitting: General Surgery

## 2021-05-15 DIAGNOSIS — K8010 Calculus of gallbladder with chronic cholecystitis without obstruction: Secondary | ICD-10-CM | POA: Insufficient documentation

## 2021-05-15 DIAGNOSIS — K8080 Other cholelithiasis without obstruction: Secondary | ICD-10-CM | POA: Diagnosis present

## 2021-05-15 HISTORY — PX: CHOLECYSTECTOMY: SHX55

## 2021-05-15 LAB — PREGNANCY, URINE: Preg Test, Ur: NEGATIVE

## 2021-05-15 LAB — GLUCOSE, CAPILLARY: Glucose-Capillary: 92 mg/dL (ref 70–99)

## 2021-05-15 SURGERY — LAPAROSCOPIC CHOLECYSTECTOMY
Anesthesia: General | Site: Abdomen

## 2021-05-15 MED ORDER — CEFAZOLIN SODIUM-DEXTROSE 2-4 GM/100ML-% IV SOLN
2.0000 g | INTRAVENOUS | Status: AC
  Administered 2021-05-15: 2 g via INTRAVENOUS
  Filled 2021-05-15: qty 100

## 2021-05-15 MED ORDER — ONDANSETRON HCL 4 MG/2ML IJ SOLN
INTRAMUSCULAR | Status: AC
  Filled 2021-05-15: qty 2

## 2021-05-15 MED ORDER — 0.9 % SODIUM CHLORIDE (POUR BTL) OPTIME
TOPICAL | Status: DC | PRN
  Administered 2021-05-15: 1000 mL

## 2021-05-15 MED ORDER — FENTANYL CITRATE (PF) 100 MCG/2ML IJ SOLN
INTRAMUSCULAR | Status: AC
  Filled 2021-05-15: qty 2

## 2021-05-15 MED ORDER — ROCURONIUM BROMIDE 10 MG/ML (PF) SYRINGE
PREFILLED_SYRINGE | INTRAVENOUS | Status: DC | PRN
  Administered 2021-05-15: 50 mg via INTRAVENOUS
  Administered 2021-05-15: 5 mg via INTRAVENOUS

## 2021-05-15 MED ORDER — OXYCODONE HCL 5 MG/5ML PO SOLN: 5.0000 mg | Freq: Once | ORAL | Status: DC | PRN

## 2021-05-15 MED ORDER — DEXAMETHASONE SODIUM PHOSPHATE 10 MG/ML IJ SOLN
INTRAMUSCULAR | Status: DC | PRN
  Administered 2021-05-15: 6 mg via INTRAVENOUS

## 2021-05-15 MED ORDER — OXYCODONE HCL 5 MG PO TABS: 5.0000 mg | ORAL_TABLET | Freq: Once | ORAL | Status: DC | PRN

## 2021-05-15 MED ORDER — PROPOFOL 10 MG/ML IV BOLUS
INTRAVENOUS | Status: AC
  Filled 2021-05-15: qty 20

## 2021-05-15 MED ORDER — PROPOFOL 10 MG/ML IV BOLUS
INTRAVENOUS | Status: DC | PRN
  Administered 2021-05-15: 130 mg via INTRAVENOUS

## 2021-05-15 MED ORDER — CHLORHEXIDINE GLUCONATE 0.12 % MT SOLN
15.0000 mL | Freq: Once | OROMUCOSAL | Status: AC
  Administered 2021-05-15: 15 mL via OROMUCOSAL

## 2021-05-15 MED ORDER — FENTANYL CITRATE PF 50 MCG/ML IJ SOSY
PREFILLED_SYRINGE | INTRAMUSCULAR | Status: AC
  Filled 2021-05-15: qty 1

## 2021-05-15 MED ORDER — ACETAMINOPHEN 160 MG/5ML PO SOLN: 325.0000 mg | ORAL | Status: DC | PRN

## 2021-05-15 MED ORDER — ENSURE PRE-SURGERY PO LIQD
296.0000 mL | Freq: Once | ORAL | Status: DC
  Filled 2021-05-15: qty 296

## 2021-05-15 MED ORDER — ACETAMINOPHEN 325 MG PO TABS: 325.0000 mg | ORAL_TABLET | ORAL | Status: DC | PRN

## 2021-05-15 MED ORDER — ROCURONIUM BROMIDE 10 MG/ML (PF) SYRINGE
PREFILLED_SYRINGE | INTRAVENOUS | Status: AC
  Filled 2021-05-15: qty 10

## 2021-05-15 MED ORDER — CELECOXIB 200 MG PO CAPS
400.0000 mg | ORAL_CAPSULE | ORAL | Status: AC
  Administered 2021-05-15: 400 mg via ORAL
  Filled 2021-05-15: qty 2

## 2021-05-15 MED ORDER — ACETAMINOPHEN 500 MG PO TABS
1000.0000 mg | ORAL_TABLET | ORAL | Status: AC
  Administered 2021-05-15: 1000 mg via ORAL
  Filled 2021-05-15: qty 2

## 2021-05-15 MED ORDER — CHLORHEXIDINE GLUCONATE CLOTH 2 % EX PADS: 6.0000 | MEDICATED_PAD | Freq: Once | CUTANEOUS | Status: DC

## 2021-05-15 MED ORDER — LACTATED RINGERS IR SOLN
Status: DC | PRN
  Administered 2021-05-15: 1000 mL

## 2021-05-15 MED ORDER — KETOROLAC TROMETHAMINE 30 MG/ML IJ SOLN
INTRAMUSCULAR | Status: DC | PRN
  Administered 2021-05-15: 30 mg via INTRAVENOUS

## 2021-05-15 MED ORDER — LACTATED RINGERS IV SOLN: INTRAVENOUS | Status: DC

## 2021-05-15 MED ORDER — LIDOCAINE 2% (20 MG/ML) 5 ML SYRINGE
INTRAMUSCULAR | Status: AC
  Filled 2021-05-15: qty 5

## 2021-05-15 MED ORDER — BUPIVACAINE-EPINEPHRINE 0.25% -1:200000 IJ SOLN
INTRAMUSCULAR | Status: DC | PRN
  Administered 2021-05-15: 30 mL

## 2021-05-15 MED ORDER — IBUPROFEN 800 MG PO TABS: 800.0000 mg | ORAL_TABLET | Freq: Three times a day (TID) | ORAL | 0 refills | Status: AC | PRN

## 2021-05-15 MED ORDER — LIDOCAINE 2% (20 MG/ML) 5 ML SYRINGE
INTRAMUSCULAR | Status: DC | PRN
  Administered 2021-05-15: 40 mg via INTRAVENOUS

## 2021-05-15 MED ORDER — ONDANSETRON HCL 4 MG/2ML IJ SOLN: 4.0000 mg | Freq: Once | INTRAMUSCULAR | Status: DC | PRN

## 2021-05-15 MED ORDER — ORAL CARE MOUTH RINSE: 15.0000 mL | Freq: Once | OROMUCOSAL | Status: AC

## 2021-05-15 MED ORDER — ONDANSETRON HCL 4 MG/2ML IJ SOLN
INTRAMUSCULAR | Status: DC | PRN
  Administered 2021-05-15: 4 mg via INTRAVENOUS

## 2021-05-15 MED ORDER — FENTANYL CITRATE (PF) 100 MCG/2ML IJ SOLN
INTRAMUSCULAR | Status: DC | PRN
  Administered 2021-05-15: 25 ug via INTRAVENOUS
  Administered 2021-05-15: 50 ug via INTRAVENOUS
  Administered 2021-05-15: 25 ug via INTRAVENOUS
  Administered 2021-05-15 (×2): 50 ug via INTRAVENOUS

## 2021-05-15 MED ORDER — DEXAMETHASONE SODIUM PHOSPHATE 10 MG/ML IJ SOLN
INTRAMUSCULAR | Status: AC
  Filled 2021-05-15: qty 1

## 2021-05-15 MED ORDER — MEPERIDINE HCL 50 MG/ML IJ SOLN: 6.2500 mg | INTRAMUSCULAR | Status: DC | PRN

## 2021-05-15 MED ORDER — BUPIVACAINE-EPINEPHRINE (PF) 0.25% -1:200000 IJ SOLN
INTRAMUSCULAR | Status: AC
  Filled 2021-05-15: qty 30

## 2021-05-15 MED ORDER — FENTANYL CITRATE PF 50 MCG/ML IJ SOSY
25.0000 ug | PREFILLED_SYRINGE | INTRAMUSCULAR | Status: DC | PRN
  Administered 2021-05-15: 25 ug via INTRAVENOUS

## 2021-05-15 MED ORDER — MIDAZOLAM HCL 2 MG/2ML IJ SOLN
INTRAMUSCULAR | Status: AC
  Filled 2021-05-15: qty 2

## 2021-05-15 MED ORDER — OXYCODONE HCL 5 MG PO TABS: 5.0000 mg | ORAL_TABLET | Freq: Four times a day (QID) | ORAL | 0 refills | Status: DC | PRN

## 2021-05-15 MED ORDER — KETOROLAC TROMETHAMINE 30 MG/ML IJ SOLN
INTRAMUSCULAR | Status: AC
  Filled 2021-05-15: qty 1

## 2021-05-15 MED ORDER — MIDAZOLAM HCL 2 MG/2ML IJ SOLN
INTRAMUSCULAR | Status: DC | PRN
  Administered 2021-05-15: 2 mg via INTRAVENOUS

## 2021-05-15 MED ORDER — SUGAMMADEX SODIUM 200 MG/2ML IV SOLN
INTRAVENOUS | Status: DC | PRN
  Administered 2021-05-15: 140 mg via INTRAVENOUS

## 2021-05-15 SURGICAL SUPPLY — 40 items
APPLIER CLIP 5 13 M/L LIGAMAX5 (MISCELLANEOUS)
APPLIER CLIP ROT 10 11.4 M/L (STAPLE)
BAG COUNTER SPONGE SURGICOUNT (BAG) IMPLANT
BENZOIN TINCTURE PRP APPL 2/3 (GAUZE/BANDAGES/DRESSINGS) ×2 IMPLANT
BNDG ADH 1X3 SHEER STRL LF (GAUZE/BANDAGES/DRESSINGS) ×8 IMPLANT
CABLE HIGH FREQUENCY MONO STRZ (ELECTRODE) ×2 IMPLANT
CHLORAPREP W/TINT 26 (MISCELLANEOUS) ×2 IMPLANT
CLIP APPLIE 5 13 M/L LIGAMAX5 (MISCELLANEOUS) IMPLANT
CLIP APPLIE ROT 10 11.4 M/L (STAPLE) IMPLANT
CLIP LIGATING HEMO O LOK GREEN (MISCELLANEOUS) ×4 IMPLANT
COVER MAYO STAND STRL (DRAPES) ×2 IMPLANT
COVER SURGICAL LIGHT HANDLE (MISCELLANEOUS) ×2 IMPLANT
DECANTER SPIKE VIAL GLASS SM (MISCELLANEOUS) ×2 IMPLANT
DRAIN CHANNEL 19F RND (DRAIN) IMPLANT
DRAPE C-ARM 42X120 X-RAY (DRAPES) IMPLANT
EVACUATOR SILICONE 100CC (DRAIN) IMPLANT
GLOVE SURG POLYISO LF SZ7 (GLOVE) ×2 IMPLANT
GLOVE SURG UNDER POLY LF SZ7 (GLOVE) ×2 IMPLANT
GOWN STRL REUS W/TWL LRG LVL3 (GOWN DISPOSABLE) ×2 IMPLANT
GOWN STRL REUS W/TWL XL LVL3 (GOWN DISPOSABLE) ×4 IMPLANT
GRASPER SUT TROCAR 14GX15 (MISCELLANEOUS) IMPLANT
KIT BASIN OR (CUSTOM PROCEDURE TRAY) ×2 IMPLANT
KIT TURNOVER KIT A (KITS) ×2 IMPLANT
POUCH RETRIEVAL ECOSAC 10 (ENDOMECHANICALS) ×1 IMPLANT
POUCH RETRIEVAL ECOSAC 10MM (ENDOMECHANICALS) ×1
SCISSORS LAP 5X35 DISP (ENDOMECHANICALS) ×2 IMPLANT
SET CHOLANGIOGRAPH MIX (MISCELLANEOUS) IMPLANT
SET IRRIG TUBING LAPAROSCOPIC (IRRIGATION / IRRIGATOR) ×2 IMPLANT
SET TUBE SMOKE EVAC HIGH FLOW (TUBING) ×2 IMPLANT
SLEEVE XCEL OPT CAN 5 100 (ENDOMECHANICALS) ×4 IMPLANT
STOPCOCK 4 WAY LG BORE MALE ST (IV SETS) IMPLANT
STRIP CLOSURE SKIN 1/2X4 (GAUZE/BANDAGES/DRESSINGS) ×2 IMPLANT
SUT ETHILON 2 0 PS N (SUTURE) IMPLANT
SUT MNCRL AB 4-0 PS2 18 (SUTURE) ×2 IMPLANT
SUT VICRYL 0 ENDOLOOP (SUTURE) IMPLANT
TOWEL OR 17X26 10 PK STRL BLUE (TOWEL DISPOSABLE) ×2 IMPLANT
TOWEL OR NON WOVEN STRL DISP B (DISPOSABLE) IMPLANT
TRAY LAPAROSCOPIC (CUSTOM PROCEDURE TRAY) ×2 IMPLANT
TROCAR BLADELESS OPT 5 100 (ENDOMECHANICALS) ×2 IMPLANT
TROCAR XCEL NON-BLD 11X100MML (ENDOMECHANICALS) ×2 IMPLANT

## 2021-05-15 NOTE — Anesthesia Postprocedure Evaluation (Signed)
Anesthesia Post Note  Patient: Cassandra Peters  Procedure(s) Performed: LAPAROSCOPIC CHOLECYSTECTOMY (Abdomen)     Patient location during evaluation: PACU Anesthesia Type: General Level of consciousness: awake and alert Pain management: pain level controlled Vital Signs Assessment: post-procedure vital signs reviewed and stable Respiratory status: spontaneous breathing, nonlabored ventilation, respiratory function stable and patient connected to nasal cannula oxygen Cardiovascular status: blood pressure returned to baseline and stable Postop Assessment: no apparent nausea or vomiting Anesthetic complications: no   No notable events documented.  Last Vitals:  Vitals:   05/15/21 1304 05/15/21 1430  BP: 108/71 114/63  Pulse: 70 70  Resp: 16 16  Temp: 36.6 C   SpO2: 98% 100%    Last Pain:  Vitals:   05/15/21 1430  TempSrc:   PainSc: 0-No pain                 Shelton Silvas

## 2021-05-15 NOTE — H&P (Signed)
  Chief Complaint: gallstones   History of Present Illness: Cassandra Peters is a 40 y.o. female who is seen today as an office consultation for evaluation of gallstones.   Cassandra Peters has had pain located in the RUQ. It first started 1 month. It occurs a few times a week. It is worsened with eating and activity. She has nausea. She has had normal stools.  Review of Systems: A complete review of systems was obtained from the patient. I have reviewed this information and discussed as appropriate with the patient. See HPI as well for other ROS.  Review of Systems  Gastrointestinal: Positive for abdominal pain and nausea.  All other systems reviewed and are negative.   Medical History: History reviewed. No pertinent past medical history.  There is no problem list on file for this patient.  History reviewed. No pertinent surgical history.   No Known Allergies  No current outpatient medications on file prior to visit.   No current facility-administered medications on file prior to visit.   Family History  Problem Relation Age of Onset   Obesity Mother   Obesity Father    Social History   Tobacco Use  Smoking Status Never Smoker  Smokeless Tobacco Never Used    Social History   Socioeconomic History   Marital status: Married  Tobacco Use   Smoking status: Never Smoker   Smokeless tobacco: Never Used  Building services engineer Use: Never used  Substance and Sexual Activity   Alcohol use: Not Currently   Drug use: Defer   Sexual activity: Defer   Objective:   Vitals:  05/02/21 1132  BP: 110/78  Pulse: 105  Temp: 36.8 C (98.3 F)  SpO2: 98%  Weight: 64.6 kg (142 lb 6.4 oz)  Height: 160 cm (5\' 3" )   Body mass index is 25.23 kg/m.  Physical Exam Constitutional:  Appearance: Normal appearance.  HENT:  Head: Normocephalic and atraumatic.  Pulmonary:  Effort: Pulmonary effort is normal.  Abdominal:  Comments: nontender today  Musculoskeletal:  General: Normal  range of motion.  Cervical back: Normal range of motion.  Neurological:  General: No focal deficit present.  Mental Status: She is alert and oriented to person, place, and time. Mental status is at baseline.  Psychiatric:  Mood and Affect: Mood normal.  Behavior: Behavior normal.  Thought Content: Thought content normal.     Labs, Imaging and Diagnostic Testing: Reviewed notes of Abdominal ultrasound results were reviewed showing gallstones  Assessment and Plan:  Diagnoses and all orders for this visit:  Calculus of gallbladder without cholecystitis without obstruction - C[facility] Case Posting Request; Future    Cassandra Peters is a 40 y.o. female with evidence of gallbladder disease. We discussed the etiology of gallstones and biliary disease and that can cause pain. We discussed exacerbating factors including fatty meals. We discussed the details of surgery for removal of the gallbladder including general anesthesia, 4 small incisions in the patient's abdomen, removal of the patient's gallbladder with the liver and common bile duct, and most likely an outpatient procedure. We discussed risks of common bile duct injury, cystic duct stump leak, injury to liver, bleeding, infection, need for open procedure, and post cholecystectomy syndrome. She showed good understanding and wanted to proceed with laparoscopic cholecystectomy.

## 2021-05-15 NOTE — Anesthesia Procedure Notes (Signed)
Procedure Name: Intubation Date/Time: 05/15/2021 10:35 AM Performed by: Florene Route, CRNA Pre-anesthesia Checklist: Patient identified, Emergency Drugs available, Suction available and Patient being monitored Patient Re-evaluated:Patient Re-evaluated prior to induction Oxygen Delivery Method: Circle system utilized Preoxygenation: Pre-oxygenation with 100% oxygen Induction Type: IV induction Ventilation: Mask ventilation without difficulty Laryngoscope Size: Miller and 2 Grade View: Grade I Tube type: Oral Tube size: 7.0 mm Number of attempts: 1 Airway Equipment and Method: Stylet and Oral airway Placement Confirmation: ETT inserted through vocal cords under direct vision, positive ETCO2 and breath sounds checked- equal and bilateral Secured at: 20 cm Tube secured with: Tape Dental Injury: Teeth and Oropharynx as per pre-operative assessment

## 2021-05-15 NOTE — Op Note (Signed)
LAPAROSCOPIC CHOLECYSTECTOMY  Procedure Note  Cassandra Peters 05/15/2021  PRE-OPERATIVE DIAGNOSIS:  GALLSTONES  POST-OPERATIVE DIAGNOSIS:  GALLSTONES  PROCEDURE:  Procedure(s): LAPAROSCOPIC CHOLECYSTECTOMY  SURGEON:  Kinsinger, De Blanch, MD   ASSISTANT: Aquilla Solian, MD - Resident  Staff:  Circulator: Selena Lesser, RN Scrub Person: Laurel Dimmer, Yancey Flemings  ANESTHESIA:   local and general  Indications for procedure: Cassandra Peters is a 40 y.o. female with symptoms of Abdominal pain consistent with gallbladder disease, Confirmed by ultrasound.  Description of procedure: The patient was brought into the operative suite, placed supine. Anesthesia was administered with endotracheal tube. Patient was strapped in place and foot board was secured. All pressure points were offloaded by foam padding. The patient was prepped and draped in the usual sterile fashion.  A left subcostal incision was made and optical entry was used to enter the abdomen. 2 5 mm trocars were placed on in the right lateral space on in the right subcostal space. A 44mm trocar was placed in the subxiphoid space. Marcaine was infused to the subxiphoid space and lateral upper right abdomen in the transversus abdominis plane. Next the patient was placed in reverse trendelenberg. The gallbladder appeared minimally inflamed and full of stones. The common bile duct appeared dilated with no evidence of stones in the lumen. Omentum was adhered to the gallbladder and was taken down with cautery/blunt dissection.  The gallbladder was retracted cephalad and lateral. The peritoneum was reflected off the infundibulum working lateral to medial. The cystic duct and cystic artery were identified and further dissection revealed a critical view. The cystic duct and cystic artery were doubly clipped and ligated and a picture of taken of the clips on the appropriate structures.   The gallbladder was removed off the liver bed  with cautery. As the gallbladder was being removed, an injury to the anterior gallbladder wall caused by cautery demonstrated hydrops with clear, mucous fluid being drained from the gallbladder. No stones were evacuated from the gallbladder. The Gallbladder was placed in a specimen bag. The gallbladder fossa was irrigated and hemostasis was applied with cautery. The gallbladder was removed via the 35mm trocar. The fascial defect was closed with interrupted 0 vicryl suture via laparoscopic trans-fascial suture passer. Pneumoperitoneum was removed, all trocar were removed. All incisions were closed with 4-0 monocryl subcuticular stitch. The patient woke from anesthesia and was brought to PACU in stable condition. All counts were correct  Findings: hydrops gallbladder, gallstones  Specimen: gallbladder  Blood loss: minimal  Local anesthesia: 30 ml Marcaine  Complications: none  PLAN OF CARE: Discharge to home after PACU  PATIENT DISPOSITION:  PACU - hemodynamically stable.  Images:     Estimated Blood Loss: Minimal               Specimens: gallbladder          Date: 05/15/2021  Time: 1:28 PM

## 2021-05-15 NOTE — Anesthesia Preprocedure Evaluation (Signed)
Anesthesia Evaluation  Patient identified by MRN, date of birth, ID band Patient awake    Reviewed: Allergy & Precautions, NPO status , Patient's Chart, lab work & pertinent test results  Airway Mallampati: II  TM Distance: >3 FB Neck ROM: Full    Dental  (+) Chipped,    Pulmonary    breath sounds clear to auscultation       Cardiovascular negative cardio ROS   Rhythm:Regular Rate:Normal     Neuro/Psych negative neurological ROS  negative psych ROS   GI/Hepatic Neg liver ROS, GERD  ,  Endo/Other  diabetes  Renal/GU negative Renal ROS     Musculoskeletal negative musculoskeletal ROS (+)   Abdominal Normal abdominal exam  (+)   Peds  Hematology negative hematology ROS (+)   Anesthesia Other Findings   Reproductive/Obstetrics                             Anesthesia Physical Anesthesia Plan  ASA: 2  Anesthesia Plan: General   Post-op Pain Management:    Induction: Intravenous  PONV Risk Score and Plan: 4 or greater and Ondansetron, Midazolam, Scopolamine patch - Pre-op and Dexamethasone  Airway Management Planned: Oral ETT  Additional Equipment: None  Intra-op Plan:   Post-operative Plan: Extubation in OR  Informed Consent: I have reviewed the patients History and Physical, chart, labs and discussed the procedure including the risks, benefits and alternatives for the proposed anesthesia with the patient or authorized representative who has indicated his/her understanding and acceptance.     Dental advisory given and Interpreter used for interveiw  Plan Discussed with: CRNA  Anesthesia Plan Comments:         Anesthesia Quick Evaluation

## 2021-05-15 NOTE — Transfer of Care (Signed)
Immediate Anesthesia Transfer of Care Note  Patient: Cassandra Peters  Procedure(s) Performed: LAPAROSCOPIC CHOLECYSTECTOMY (Abdomen)  Patient Location: PACU  Anesthesia Type:General  Level of Consciousness: drowsy  Airway & Oxygen Therapy: Patient Spontanous Breathing and Patient connected to face mask oxygen  Post-op Assessment: Report given to RN and Post -op Vital signs reviewed and stable  Post vital signs: Reviewed and stable  Last Vitals:  Vitals Value Taken Time  BP    Temp    Pulse 96 05/15/21 1203  Resp 18 05/15/21 1203  SpO2 100 % 05/15/21 1203  Vitals shown include unvalidated device data.  Last Pain:  Vitals:   05/15/21 0941  TempSrc:   PainSc: 0-No pain         Complications: No notable events documented.

## 2021-05-16 ENCOUNTER — Encounter (HOSPITAL_COMMUNITY): Payer: Self-pay | Admitting: General Surgery

## 2021-05-16 LAB — SURGICAL PATHOLOGY

## 2021-05-20 ENCOUNTER — Encounter (HOSPITAL_COMMUNITY): Payer: Self-pay

## 2021-05-20 ENCOUNTER — Emergency Department (HOSPITAL_COMMUNITY): Payer: 59

## 2021-05-20 ENCOUNTER — Other Ambulatory Visit: Payer: Self-pay

## 2021-05-20 ENCOUNTER — Inpatient Hospital Stay (HOSPITAL_COMMUNITY)
Admission: EM | Admit: 2021-05-20 | Discharge: 2021-05-30 | DRG: 356 | Disposition: A | Discharge status: Home / Self Care | Payer: 59 | Attending: General Surgery | Admitting: General Surgery

## 2021-05-20 DIAGNOSIS — R102 Pelvic and perineal pain: Secondary | ICD-10-CM | POA: Diagnosis not present

## 2021-05-20 DIAGNOSIS — D72829 Elevated white blood cell count, unspecified: Secondary | ICD-10-CM

## 2021-05-20 DIAGNOSIS — T85520A Displacement of bile duct prosthesis, initial encounter: Secondary | ICD-10-CM | POA: Diagnosis not present

## 2021-05-20 DIAGNOSIS — R7303 Prediabetes: Secondary | ICD-10-CM | POA: Diagnosis present

## 2021-05-20 DIAGNOSIS — Z20822 Contact with and (suspected) exposure to covid-19: Secondary | ICD-10-CM | POA: Diagnosis present

## 2021-05-20 DIAGNOSIS — T819XXA Unspecified complication of procedure, initial encounter: Secondary | ICD-10-CM | POA: Diagnosis present

## 2021-05-20 DIAGNOSIS — K859 Acute pancreatitis without necrosis or infection, unspecified: Secondary | ICD-10-CM | POA: Diagnosis not present

## 2021-05-20 DIAGNOSIS — Y828 Other medical devices associated with adverse incidents: Secondary | ICD-10-CM | POA: Diagnosis not present

## 2021-05-20 DIAGNOSIS — K9189 Other postprocedural complications and disorders of digestive system: Secondary | ICD-10-CM | POA: Diagnosis not present

## 2021-05-20 DIAGNOSIS — D649 Anemia, unspecified: Secondary | ICD-10-CM | POA: Diagnosis not present

## 2021-05-20 DIAGNOSIS — Y838 Other surgical procedures as the cause of abnormal reaction of the patient, or of later complication, without mention of misadventure at the time of the procedure: Secondary | ICD-10-CM | POA: Diagnosis present

## 2021-05-20 DIAGNOSIS — Z4659 Encounter for fitting and adjustment of other gastrointestinal appliance and device: Secondary | ICD-10-CM

## 2021-05-20 DIAGNOSIS — K8050 Calculus of bile duct without cholangitis or cholecystitis without obstruction: Secondary | ICD-10-CM

## 2021-05-20 DIAGNOSIS — Q438 Other specified congenital malformations of intestine: Secondary | ICD-10-CM

## 2021-05-20 DIAGNOSIS — S3613XA Injury of bile duct, initial encounter: Secondary | ICD-10-CM | POA: Diagnosis not present

## 2021-05-20 DIAGNOSIS — K219 Gastro-esophageal reflux disease without esophagitis: Secondary | ICD-10-CM | POA: Diagnosis present

## 2021-05-20 DIAGNOSIS — R109 Unspecified abdominal pain: Secondary | ICD-10-CM | POA: Diagnosis present

## 2021-05-20 DIAGNOSIS — E872 Acidosis: Secondary | ICD-10-CM | POA: Diagnosis present

## 2021-05-20 DIAGNOSIS — G8918 Other acute postprocedural pain: Secondary | ICD-10-CM | POA: Diagnosis present

## 2021-05-20 DIAGNOSIS — R188 Other ascites: Secondary | ICD-10-CM | POA: Diagnosis present

## 2021-05-20 DIAGNOSIS — E876 Hypokalemia: Secondary | ICD-10-CM | POA: Diagnosis present

## 2021-05-20 DIAGNOSIS — Z8616 Personal history of COVID-19: Secondary | ICD-10-CM

## 2021-05-20 DIAGNOSIS — Z8249 Family history of ischemic heart disease and other diseases of the circulatory system: Secondary | ICD-10-CM

## 2021-05-20 DIAGNOSIS — R748 Abnormal levels of other serum enzymes: Secondary | ICD-10-CM | POA: Diagnosis not present

## 2021-05-20 DIAGNOSIS — Z9049 Acquired absence of other specified parts of digestive tract: Secondary | ICD-10-CM

## 2021-05-20 DIAGNOSIS — T8189XA Other complications of procedures, not elsewhere classified, initial encounter: Secondary | ICD-10-CM | POA: Diagnosis present

## 2021-05-20 DIAGNOSIS — T85590A Other mechanical complication of bile duct prosthesis, initial encounter: Secondary | ICD-10-CM | POA: Diagnosis not present

## 2021-05-20 DIAGNOSIS — Z833 Family history of diabetes mellitus: Secondary | ICD-10-CM

## 2021-05-20 DIAGNOSIS — K653 Choleperitonitis: Secondary | ICD-10-CM | POA: Diagnosis present

## 2021-05-20 LAB — I-STAT BETA HCG BLOOD, ED (MC, WL, AP ONLY): I-stat hCG, quantitative: <5 m[IU]/mL (ref <5)

## 2021-05-20 LAB — CBC WITH DIFFERENTIAL/PLATELET
Abs Immature Granulocytes: 0.09 10*3/uL — ABNORMAL HIGH (ref 0.00–0.07)
Basophils Absolute: 0 10*3/uL (ref 0.0–0.1)
Basophils Relative: 0 %
Eosinophils Absolute: 0 10*3/uL (ref 0.0–0.5)
Eosinophils Relative: 0 %
HCT: 42.3 % (ref 36.0–46.0)
Hemoglobin: 14.3 g/dL (ref 12.0–15.0)
Immature Granulocytes: 1 %
Lymphocytes Relative: 17 %
Lymphs Abs: 2.6 10*3/uL (ref 0.7–4.0)
MCH: 30 pg (ref 26.0–34.0)
MCHC: 33.8 g/dL (ref 30.0–36.0)
MCV: 88.9 fL (ref 80.0–100.0)
Monocytes Absolute: 0.7 10*3/uL (ref 0.1–1.0)
Monocytes Relative: 4 %
Neutro Abs: 12 10*3/uL — ABNORMAL HIGH (ref 1.7–7.7)
Neutrophils Relative %: 78 %
Platelets: 287 10*3/uL (ref 150–400)
RBC: 4.76 MIL/uL (ref 3.87–5.11)
RDW: 12.2 % (ref 11.5–15.5)
WBC: 15.5 10*3/uL — ABNORMAL HIGH (ref 4.0–10.5)
nRBC: 0 % (ref 0.0–0.2)

## 2021-05-20 LAB — COMPREHENSIVE METABOLIC PANEL
ALT: 24 U/L (ref 0–44)
AST: 20 U/L (ref 15–41)
Albumin: 4.7 g/dL (ref 3.5–5.0)
Alkaline Phosphatase: 48 U/L (ref 38–126)
Anion gap: 14 (ref 5–15)
BUN: 13 mg/dL (ref 6–20)
CO2: 20 mmol/L — ABNORMAL LOW (ref 22–32)
Calcium: 9.7 mg/dL (ref 8.9–10.3)
Chloride: 106 mmol/L (ref 98–111)
Creatinine, Ser: 0.64 mg/dL (ref 0.44–1.00)
GFR, Estimated: >60 mL/min (ref >60)
Glucose, Bld: 233 mg/dL — ABNORMAL HIGH (ref 70–99)
Potassium: 3.5 mmol/L (ref 3.5–5.1)
Sodium: 140 mmol/L (ref 135–145)
Total Bilirubin: 1.1 mg/dL (ref 0.3–1.2)
Total Protein: 7.7 g/dL (ref 6.5–8.1)

## 2021-05-20 LAB — URINALYSIS, ROUTINE W REFLEX MICROSCOPIC
Bilirubin Urine: NEGATIVE
Glucose, UA: 100 mg/dL — AB
Hgb urine dipstick: NEGATIVE
Ketones, ur: 15 mg/dL — AB
Leukocytes,Ua: NEGATIVE
Nitrite: NEGATIVE
Protein, ur: NEGATIVE mg/dL
Specific Gravity, Urine: <1.005 — ABNORMAL LOW (ref 1.005–1.030)
pH: 6.5 (ref 5.0–8.0)

## 2021-05-20 LAB — LACTIC ACID, PLASMA
Lactic Acid, Venous: 2.6 mmol/L (ref 0.5–1.9)
Lactic Acid, Venous: 3.5 mmol/L (ref 0.5–1.9)
Lactic Acid, Venous: 1.1 mmol/L (ref 0.5–1.9)

## 2021-05-20 LAB — I-STAT CHEM 8, ED
BUN: 10 mg/dL (ref 6–20)
Calcium, Ion: 1.19 mmol/L (ref 1.15–1.40)
Chloride: 104 mmol/L (ref 98–111)
Creatinine, Ser: 0.6 mg/dL (ref 0.44–1.00)
Glucose, Bld: 230 mg/dL — ABNORMAL HIGH (ref 70–99)
HCT: 42 % (ref 36.0–46.0)
Hemoglobin: 14.3 g/dL (ref 12.0–15.0)
Potassium: 3.4 mmol/L — ABNORMAL LOW (ref 3.5–5.1)
Sodium: 138 mmol/L (ref 135–145)
TCO2: 21 mmol/L — ABNORMAL LOW (ref 22–32)

## 2021-05-20 LAB — PROTIME-INR
INR: 1 (ref 0.8–1.2)
Prothrombin Time: 13.3 seconds (ref 11.4–15.2)

## 2021-05-20 LAB — LIPASE, BLOOD: Lipase: 35 U/L (ref 11–51)

## 2021-05-20 LAB — APTT: aPTT: 27 seconds (ref 24–36)

## 2021-05-20 MED ORDER — PIPERACILLIN-TAZOBACTAM 3.375 G IVPB 30 MIN
3.3750 g | Freq: Once | INTRAVENOUS | Status: AC
  Administered 2021-05-20: 3.375 g via INTRAVENOUS
  Filled 2021-05-20: qty 50

## 2021-05-20 MED ORDER — HYDROMORPHONE HCL 1 MG/ML IJ SOLN
1.0000 mg | Freq: Once | INTRAMUSCULAR | Status: AC
  Administered 2021-05-20: 1 mg via INTRAVENOUS
  Filled 2021-05-20: qty 1

## 2021-05-20 MED ORDER — IOHEXOL 350 MG/ML SOLN
80.0000 mL | Freq: Once | INTRAVENOUS | Status: AC | PRN
  Administered 2021-05-20: 80 mL via INTRAVENOUS

## 2021-05-20 MED ORDER — POTASSIUM CHLORIDE IN NACL 20-0.9 MEQ/L-% IV SOLN
INTRAVENOUS | Status: DC
  Filled 2021-05-20 (×17): qty 1000

## 2021-05-20 MED ORDER — MORPHINE SULFATE (PF) 2 MG/ML IV SOLN
1.0000 mg | INTRAVENOUS | Status: DC | PRN
  Administered 2021-05-20: 2 mg via INTRAVENOUS
  Administered 2021-05-20: 4 mg via INTRAVENOUS
  Administered 2021-05-20 (×4): 2 mg via INTRAVENOUS
  Administered 2021-05-20 – 2021-05-21 (×2): 4 mg via INTRAVENOUS
  Filled 2021-05-20: qty 1
  Filled 2021-05-20: qty 2
  Filled 2021-05-20 (×3): qty 1
  Filled 2021-05-20 (×2): qty 2
  Filled 2021-05-20: qty 1

## 2021-05-20 MED ORDER — ONDANSETRON HCL 4 MG/2ML IJ SOLN: 4.0000 mg | Freq: Four times a day (QID) | INTRAMUSCULAR | Status: DC | PRN

## 2021-05-20 MED ORDER — ONDANSETRON 4 MG PO TBDP: 4.0000 mg | ORAL_TABLET | Freq: Four times a day (QID) | ORAL | Status: DC | PRN

## 2021-05-20 MED ORDER — ENOXAPARIN SODIUM 40 MG/0.4ML IJ SOSY
40.0000 mg | PREFILLED_SYRINGE | INTRAMUSCULAR | Status: DC
  Administered 2021-05-20: 40 mg via SUBCUTANEOUS
  Filled 2021-05-20 (×2): qty 0.4

## 2021-05-20 MED ORDER — LACTATED RINGERS IV BOLUS
1000.0000 mL | Freq: Once | INTRAVENOUS | Status: AC
  Administered 2021-05-20: 1000 mL via INTRAVENOUS

## 2021-05-20 MED ORDER — FENTANYL CITRATE PF 50 MCG/ML IJ SOSY
50.0000 ug | PREFILLED_SYRINGE | Freq: Once | INTRAMUSCULAR | Status: AC
Start: 2021-05-20 — End: 2021-05-20
  Administered 2021-05-20: 50 ug via INTRAVENOUS
  Filled 2021-05-20: qty 1

## 2021-05-20 NOTE — ED Notes (Signed)
Pt. I-stat Chem 8 results potassium 3.4. RN, Vincenza Hews and Mesner, MD. Made aware.

## 2021-05-20 NOTE — ED Notes (Signed)
Purewick placed on pt. 

## 2021-05-20 NOTE — ED Provider Notes (Signed)
[facility]-EMERGENCY DEPT Provider Note   CSN: [id number] Arrival date & time: 05/20/21  0228     History Chief Complaint  Patient presents with   Post-op Problem    Kaina Furniss is a 40 y.o. female.  40 year old female who speaks Arabic and recently had a cholecystectomy.  Patient states that she was doing fine without any pain until 4:00 on September 10.  She states she all of a sudden had severe abdominal pain seem to be epigastric in nature.  This is unlike any she had before.  She did not try much for her symptoms but it got worse and worse so she came here for further evaluation.  Some nausea no vomiting.  No fevers.  No urinary or other GI symptoms.  When asked her for skin appears yellow she confirms that it does.       Past Medical History:  Diagnosis Date   COVID-19 03/2021   Dyspnea    During Covid in 03/2021   GERD (gastroesophageal reflux disease)    Gestational diabetes    Pre-diabetes     Patient Active Problem List   Diagnosis Date Noted   Postoperative abdominal pain 05/20/2021   Gestational diabetes mellitus 05/02/2020   Labor and delivery, indication for care 12/11/2017    Past Surgical History:  Procedure Laterality Date   CHOLECYSTECTOMY N/A 05/15/2021   Procedure: LAPAROSCOPIC CHOLECYSTECTOMY;  Surgeon: Rodman Pickle, MD;  Location: [facility] ORS;  Service: General;  Laterality: N/A;   NO PAST SURGERIES       OB History     Gravida  3   Para  2   Term  2   Preterm  0   AB  0   Living  2      SAB  0   IAB  0   Ectopic  0   Multiple  0   Live Births  2           Family History  Problem Relation Age of Onset   Diabetes Mother    Hypertension Mother     Social History   Tobacco Use   Smoking status: Never   Smokeless tobacco: Never  Vaping Use   Vaping Use: Never used  Substance Use Topics   Alcohol use: No   Drug use: No    Home Medications Prior to Admission medications   Medication  Sig Start Date End Date Taking? Authorizing Provider  cholecalciferol (VITAMIN D3) 25 MCG (1000 UNIT) tablet Take 1,000 Units by mouth daily.    [provider]  ibuprofen (ADVIL) 800 MG tablet Take 1 tablet (800 mg total) by mouth every 8 (eight) hours as needed. 05/15/21   Kinsinger, De Blanch, MD  oxyCODONE (OXY IR/ROXICODONE) 5 MG immediate release tablet Take 1 tablet (5 mg total) by mouth every 6 (six) hours as needed for severe pain. 05/15/21   Kinsinger, De Blanch, MD  vitamin B-12 (CYANOCOBALAMIN) 1000 MCG tablet Take 1,000 mcg by mouth daily.    [provider]    Allergies    Patient has no known allergies.  Review of Systems   Review of Systems  Physical Exam Updated Vital Signs BP 109/67 (BP Location: Left Arm)   Pulse 78   Temp 98.4 F (36.9 C) (Oral)   Resp 17   SpO2 100%   Physical Exam  ED Results / Procedures / Treatments   Labs (all labs ordered are listed, but only abnormal results are displayed)  Labs Reviewed  COMPREHENSIVE METABOLIC PANEL - Abnormal; Notable for the following components:      Result Value   CO2 20 (*)    Glucose, Bld 233 (*)    All other components within normal limits  LACTIC ACID, PLASMA - Abnormal; Notable for the following components:   Lactic Acid, Venous 2.6 (*)    All other components within normal limits  CBC WITH DIFFERENTIAL/PLATELET - Abnormal; Notable for the following components:   WBC 15.5 (*)    Neutro Abs 12.0 (*)    Abs Immature Granulocytes 0.09 (*)    All other components within normal limits  I-STAT CHEM 8, ED - Abnormal; Notable for the following components:   Potassium 3.4 (*)    Glucose, Bld 230 (*)    TCO2 21 (*)    All other components within normal limits  CULTURE, BLOOD (ROUTINE X 2)  CULTURE, BLOOD (SINGLE)  URINE CULTURE  PROTIME-INR  APTT  LIPASE, BLOOD  LACTIC ACID, PLASMA  URINALYSIS, ROUTINE W REFLEX MICROSCOPIC  I-STAT BETA HCG BLOOD, ED ([facility], [facility], [facility] ONLY)    EKG EKG  Interpretation  Date/Time:  Sunday May 20 2021 02:54:20 EDT Ventricular Rate:  102 PR Interval:  135 QRS Duration: 97 QT Interval:  371 QTC Calculation: 484 R Axis:   42 Text Interpretation: Sinus tachycardia Low voltage, precordial leads RSR' in V1 or V2, right VCD or RVH Baseline wander in lead(s) V3 V4 Confirmed by Marily Memos [phone]) on 05/20/2021 4:07:25 AM  Radiology CT ABDOMEN PELVIS W CONTRAST  Result Date: 05/20/2021 CLINICAL DATA:  Abdominal pain, cholecystectomy on 05/15/2021 EXAM: CT ABDOMEN AND PELVIS WITH CONTRAST TECHNIQUE: Multidetector CT imaging of the abdomen and pelvis was performed using the standard protocol following bolus administration of intravenous contrast. CONTRAST:  33mL OMNIPAQUE IOHEXOL 350 MG/ML SOLN COMPARISON:  Right upper quadrant ultrasound dated 11/22/2020 FINDINGS: Lower chest: Lung bases are clear. Hepatobiliary: Liver is within normal limits. Status post cholecystectomy. Trace fluid/stranding along the gallbladder fossa and common duct (series 2/image 22), likely postsurgical. No intrahepatic ductal dilatation. Common duct measures 12 mm and smoothly tapers at the ampulla. Pancreas: Within normal limits. Spleen: Within normal limits. Adrenals/Urinary Tract: Adrenal glands are within normal limits. Kidneys are within normal limits.  No hydronephrosis. Layering excretory contrast in the bladder. Stomach/Bowel: Stomach is within normal limits. No evidence of bowel obstruction. Normal appendix (series 2/image 59). Left colon is decompressed. No colonic wall thickening or inflammatory changes. Vascular/Lymphatic: No evidence of abdominal aortic aneurysm. No suspicious abdominopelvic lymphadenopathy. Reproductive: Uterus is within normal limits. Bilateral ovaries are within normal limits. Other: Small volume pelvic ascites (series 2/image 72). No free air. Musculoskeletal: Visualized osseous structures are within normal limits. IMPRESSION: Postsurgical changes  related to recent cholecystectomy. Small volume pelvic ascites. No free air. Common duct measures 12 mm and smoothly tapers at the ampulla. In the setting of normal LFTs, this appearance is likely postsurgical. Electronically Signed   By: Charline Bills M.D.   On: 05/20/2021 04:07    Procedures Procedures   Medications Ordered in ED Medications  lactated ringers bolus 1,000 mL (0 mLs Intravenous Stopped 05/20/21 0418)  fentaNYL (SUBLIMAZE) injection 50 mcg (50 mcg Intravenous Given 05/20/21 0255)  piperacillin-tazobactam (ZOSYN) IVPB 3.375 g (0 g Intravenous Stopped 05/20/21 0326)  iohexol (OMNIPAQUE) 350 MG/ML injection 80 mL (80 mLs Intravenous Contrast Given 05/20/21 0328)  HYDROmorphone (DILAUDID) injection 1 mg (1 mg Intravenous Given 05/20/21 0354)    ED Course  I have reviewed  the triage vital signs and the nursing notes.  Pertinent labs & imaging results that were available during my care of the patient were reviewed by me and considered in my medical decision making (see chart for details).    MDM Rules/Calculators/A&P                         Patient was very ill-appearing on arrival.  She was clammy, pale diaphoretic.  Her abdomen was diffusely tender but worse in epigastric and right upper quadrant.  Concern for possible bile leak versus abscess versus peritonitis patient was started on sepsis protocol with antibiotics, fluids and all the other labs.  CT scan was obtained as quick as possible which ended up showing likely normal postsurgical changes.  After couple doses of pain medication her pain did improve from a 10 to a 5 and she was much more comfortable.  I discussed with surgery of my concern for possible bile leak versus other complications.  He said that the best test would be a HIDA scan and they would put in orders and see her when they were able. HIDA ordered.    Final Clinical Impression(s) / ED Diagnoses Final diagnoses:  Post-op pain  Leukocytosis, unspecified type     Rx / DC Orders ED Discharge Orders     None        Ashleah Valtierra, Barbara Cower, MD 05/20/21 [phone]

## 2021-05-20 NOTE — ED Notes (Signed)
RN attempted X2 to straight stick pt for second lactic, as IV's would not pull back. RN asked for assistance from another practitioner.

## 2021-05-20 NOTE — ED Triage Notes (Signed)
[facility] from home. Pt speaks Arabic. Pt recently had gallbladder surgery, sutures are closed. Pt is having abdominal pain and rigidity. Pt cool and diaphoretic.

## 2021-05-20 NOTE — H&P (Signed)
Cassandra Peters is an 40 y.o. female.   Chief Complaint: Abdominal pain HPI: Patient presents to the emergency room with a sudden onset of severe abdominal pain at 4 PM yesterday.  She is 5 days status post laparoscopic cholecystectomy by Dr. Sheliah Hatch.  An Print production planner was used to communicate with her.  She states at 4 PM yesterday she developed the sudden onset of diffuse abdominal pain.  This was associate with nausea and vomiting.  Now her pain is more in a super pubic region when she points to it but does not point to other areas of her abdomen as well.  CT scan was done which shows minimal postsurgical changes.  No evidence of free air.  Her white count is 15,500.  She has a persistent lactic acidosis.  She has no evidence of hypotension, tachycardia or fever currently.  She feels better than she did when she first came to the hospital.  Past Medical History:  Diagnosis Date   COVID-19 03/2021   Dyspnea    During Covid in 03/2021   GERD (gastroesophageal reflux disease)    Gestational diabetes    Pre-diabetes     Past Surgical History:  Procedure Laterality Date   CHOLECYSTECTOMY N/A 05/15/2021   Procedure: LAPAROSCOPIC CHOLECYSTECTOMY;  Surgeon: Kinsinger, De Blanch, MD;  Location: [facility];  Service: General;  Laterality: N/A;   NO PAST SURGERIES      Family History  Problem Relation Age of Onset   Diabetes Mother    Hypertension Mother    Social History:  reports that she has never smoked. She has never used smokeless tobacco. She reports that she does not drink alcohol and does not use drugs.  Allergies: No Known Allergies  Medications Prior to Admission  Medication Sig Dispense Refill   cholecalciferol (VITAMIN D3) 25 MCG (1000 UNIT) tablet Take 1,000 Units by mouth daily.     ibuprofen (ADVIL) 800 MG tablet Take 1 tablet (800 mg total) by mouth every 8 (eight) hours as needed. 30 tablet 0   oxyCODONE (OXY IR/ROXICODONE) 5 MG immediate release tablet Take 1 tablet (5 mg  total) by mouth every 6 (six) hours as needed for severe pain. 10 tablet 0   vitamin B-12 (CYANOCOBALAMIN) 1000 MCG tablet Take 1,000 mcg by mouth daily.      Results for orders placed or performed during the hospital encounter of 05/20/21 (from the past 48 hour(s))  Comprehensive metabolic panel     Status: Abnormal   Collection Time: 05/20/21  2:46 AM  Result Value Ref Range   Sodium 140 135 - 145 mmol/L   Potassium 3.5 3.5 - 5.1 mmol/L   Chloride 106 98 - 111 mmol/L   CO2 20 (L) 22 - 32 mmol/L   Glucose, Bld 233 (H) 70 - 99 mg/dL    Comment: Glucose reference range applies only to samples taken after fasting for at least 8 hours.   BUN 13 6 - 20 mg/dL   Creatinine, Ser 3.35 0.44 - 1.00 mg/dL   Calcium 9.7 8.9 - 45.6 mg/dL   Total Protein 7.7 6.5 - 8.1 g/dL   Albumin 4.7 3.5 - 5.0 g/dL   AST 20 15 - 41 U/L   ALT 24 0 - 44 U/L   Alkaline Phosphatase 48 38 - 126 U/L   Total Bilirubin 1.1 0.3 - 1.2 mg/dL   GFR, Estimated >25 >63 mL/min    Comment: (NOTE) Calculated using the CKD-EPI Creatinine Equation (2021)    Anion gap  14 5 - 15    Comment: Performed at [facility], [address] [place], [place] [address]  Lactic acid, plasma     Status: Abnormal   Collection Time: 05/20/21  2:46 AM  Result Value Ref Range   Lactic Acid, Venous 2.6 (HH) 0.5 - 1.9 mmol/L    Comment: CRITICAL RESULT CALLED TO, READ BACK BY AND VERIFIED WITH: KERSCHER Q @0315  BY BATTLET NO VISIBLE HEMOLYSIS Performed at [facility], [address] [place], [place] [address]   CBC with Differential     Status: Abnormal   Collection Time: 05/20/21  2:46 AM  Result Value Ref Range   WBC 15.5 (H) 4.0 - 10.5 K/uL   RBC 4.76 3.87 - 5.11 MIL/uL   Hemoglobin 14.3 12.0 - 15.0 g/dL   HCT 09.842.3 11.936.0 - 14.746.0 %   MCV 88.9 80.0 - 100.0 fL   MCH 30.0 26.0 - 34.0 pg   MCHC 33.8 30.0 - 36.0 g/dL   RDW 82.912.2 56.211.5 - 13.015.5 %   Platelets 287 150 - 400 K/uL   nRBC 0.0 0.0 - 0.2 %    Neutrophils Relative % 78 %   Neutro Abs 12.0 (H) 1.7 - 7.7 K/uL   Lymphocytes Relative 17 %   Lymphs Abs 2.6 0.7 - 4.0 K/uL   Monocytes Relative 4 %   Monocytes Absolute 0.7 0.1 - 1.0 K/uL   Eosinophils Relative 0 %   Eosinophils Absolute 0.0 0.0 - 0.5 K/uL   Basophils Relative 0 %   Basophils Absolute 0.0 0.0 - 0.1 K/uL   Immature Granulocytes 1 %   Abs Immature Granulocytes 0.09 (H) 0.00 - 0.07 K/uL    Comment: Performed at [facility], [address] [place], [place] [address]  Protime-INR     Status: None   Collection Time: 05/20/21  2:46 AM  Result Value Ref Range   Prothrombin Time 13.3 11.4 - 15.2 seconds   INR 1.0 0.8 - 1.2    Comment: (NOTE) INR goal varies based on device and disease states. Performed at [facility], [address] [place], [place] [address]   APTT     Status: None   Collection Time: 05/20/21  2:46 AM  Result Value Ref Range   aPTT 27 24 - 36 seconds    Comment: Performed at [facility], [address] [place], [place] [address]  Lipase, blood     Status: None   Collection Time: 05/20/21  2:46 AM  Result Value Ref Range   Lipase 35 11 - 51 U/L    Comment: Performed at [facility], [address] [place], [place] [address]  I-Stat beta hCG blood, ED     Status: None   Collection Time: 05/20/21  2:49 AM  Result Value Ref Range   I-stat hCG, quantitative <5.0 <5 mIU/mL   Comment 3            Comment:   GEST. AGE      CONC.  (mIU/mL)   <=1 WEEK        5 - 50     2 WEEKS       50 - 500     3 WEEKS       100 - 10,000     4 WEEKS     1,000 - 30,000        FEMALE AND NON-PREGNANT FEMALE:     LESS THAN  5 mIU/mL   I-stat chem 8, ED (not at [facility] or [facility])     Status: Abnormal   Collection Time: 05/20/21  3:11 AM  Result Value Ref Range   Sodium 138 135 - 145 mmol/L   Potassium 3.4 (L) 3.5 - 5.1 mmol/L   Chloride 104 98 - 111 mmol/L   BUN 10 6 - 20 mg/dL   Creatinine,  Ser 7.37 0.44 - 1.00 mg/dL   Glucose, Bld 106 (H) 70 - 99 mg/dL    Comment: Glucose reference range applies only to samples taken after fasting for at least 8 hours.   Calcium, Ion 1.19 1.15 - 1.40 mmol/L   TCO2 21 (L) 22 - 32 mmol/L   Hemoglobin 14.3 12.0 - 15.0 g/dL   HCT 26.9 48.5 - 46.2 %  Lactic acid, plasma     Status: Abnormal   Collection Time: 05/20/21  5:12 AM  Result Value Ref Range   Lactic Acid, Venous 3.5 (HH) 0.5 - 1.9 mmol/L    Comment: CRITICAL RESULT CALLED TO, READ BACK BY AND VERIFIED WITH: KERSCHER Q @0540  BY BATTLET NO VISIBLE HEMOLYSIS Performed at [facility], [address], [place], [place]   Urinalysis, Routine w reflex microscopic Urine, Clean Catch     Status: Abnormal   Collection Time: 05/20/21  6:29 AM  Result Value Ref Range   Color, Urine YELLOW (A) YELLOW   APPearance CLEAR (A) CLEAR   Specific Gravity, Urine <1.005 (L) 1.005 - 1.030   pH 6.5 5.0 - 8.0   Glucose, UA 100 (A) NEGATIVE mg/dL   Hgb urine dipstick NEGATIVE NEGATIVE   Bilirubin Urine NEGATIVE NEGATIVE   Ketones, ur 15 (A) NEGATIVE mg/dL   Protein, ur NEGATIVE NEGATIVE mg/dL   Nitrite NEGATIVE NEGATIVE   Leukocytes,Ua NEGATIVE NEGATIVE   RBC / HPF 0-5 0 - 5 RBC/hpf   Bacteria, UA RARE (A) NONE SEEN   Squamous Epithelial / LPF 0-5 0 - 5    Comment: Performed at [facility], [address] [place], [place]   CT ABDOMEN PELVIS W CONTRAST  Result Date: 05/20/2021 CLINICAL DATA:  Abdominal pain, cholecystectomy on 05/15/2021 EXAM: CT ABDOMEN AND PELVIS WITH CONTRAST TECHNIQUE: Multidetector CT imaging of the abdomen and pelvis was performed using the standard protocol following bolus administration of intravenous contrast. CONTRAST:  33mL OMNIPAQUE IOHEXOL 350 MG/ML SOLN COMPARISON:  Right upper quadrant ultrasound dated 11/22/2020 FINDINGS: Lower chest: Lung bases are clear. Hepatobiliary: Liver is within normal limits. Status  post cholecystectomy. Trace fluid/stranding along the gallbladder fossa and common duct (series 2/image 22), likely postsurgical. No intrahepatic ductal dilatation. Common duct measures 12 mm and smoothly tapers at the ampulla. Pancreas: Within normal limits. Spleen: Within normal limits. Adrenals/Urinary Tract: Adrenal glands are within normal limits. Kidneys are within normal limits.  No hydronephrosis. Layering excretory contrast in the bladder. Stomach/Bowel: Stomach is within normal limits. No evidence of bowel obstruction. Normal appendix (series 2/image 59). Left colon is decompressed. No colonic wall thickening or inflammatory changes. Vascular/Lymphatic: No evidence of abdominal aortic aneurysm. No suspicious abdominopelvic lymphadenopathy. Reproductive: Uterus is within normal limits. Bilateral ovaries are within normal limits. Other: Small volume pelvic ascites (series 2/image 72). No free air. Musculoskeletal: Visualized osseous structures are within normal limits. IMPRESSION: Postsurgical changes related to recent cholecystectomy. Small volume pelvic ascites. No free air. Common duct measures 12 mm and smoothly tapers at the ampulla. In the setting of normal LFTs, this appearance is likely  postsurgical. Electronically Signed   By: Charline Bills M.D.   On: 05/20/2021 04:07    Review of Systems  Constitutional:  Positive for diaphoresis and fatigue.  HENT: Negative.    Eyes: Negative.   Respiratory: Negative.    Cardiovascular: Negative.   Gastrointestinal:  Positive for abdominal pain, nausea and vomiting.  Endocrine: Negative.   Genitourinary:  Negative for dysuria and flank pain.  Musculoskeletal: Negative.   Skin: Negative.   Hematological: Negative.   Psychiatric/Behavioral: Negative.     Blood pressure 126/75, pulse 95, temperature 98.6 F (37 C), temperature source Oral, resp. rate 20, SpO2 100 %. Physical Exam Vitals reviewed.  Constitutional:      Appearance: Normal  appearance.  HENT:     Head: Normocephalic.     Nose: Nose normal.  Eyes:     General: No scleral icterus.    Pupils: Pupils are equal, round, and reactive to light.  Cardiovascular:     Rate and Rhythm: Normal rate and regular rhythm.  Pulmonary:     Effort: Pulmonary effort is normal.     Breath sounds: No stridor.  Abdominal:     General: Abdomen is flat.     Tenderness: There is abdominal tenderness in the suprapubic area. There is no guarding or rebound.       Comments: Port sites clean dry and intact.  Mild tenderness over the pubic region.  Soft without peritonitis.  No rebound or guarding noted.  Musculoskeletal:        General: Normal range of motion.     Cervical back: Normal range of motion.  Neurological:     General: No focal deficit present.     Mental Status: She is alert.  Psychiatric:        Mood and Affect: Mood normal.        Behavior: Behavior normal.     Assessment/Plan Postop day 5 status post laparoscopic cholecystectomy with a sudden onset of diffuse abdominal pain  CT scan is unremarkable which I reviewed today.  Admit for IV fluids, pain control, HIDA study to exclude bile leak.  Examination does not support bowel injury given lack of peritonitis, rebound or guarding.  Given her suprapubic discomfort, send a UA to exclude UTI  Recheck lactate later this morning  If condition worsens, recommend laparoscopy to further evaluate at this point in time   Print production planner used for all communication.  Dortha Schwalbe, MD 05/20/2021, 8:39 AM

## 2021-05-21 ENCOUNTER — Observation Stay (HOSPITAL_COMMUNITY): Payer: 59

## 2021-05-21 DIAGNOSIS — R188 Other ascites: Secondary | ICD-10-CM | POA: Diagnosis present

## 2021-05-21 DIAGNOSIS — K859 Acute pancreatitis without necrosis or infection, unspecified: Secondary | ICD-10-CM | POA: Diagnosis not present

## 2021-05-21 DIAGNOSIS — D649 Anemia, unspecified: Secondary | ICD-10-CM | POA: Diagnosis not present

## 2021-05-21 DIAGNOSIS — Y828 Other medical devices associated with adverse incidents: Secondary | ICD-10-CM | POA: Diagnosis not present

## 2021-05-21 DIAGNOSIS — K9189 Other postprocedural complications and disorders of digestive system: Secondary | ICD-10-CM | POA: Diagnosis present

## 2021-05-21 DIAGNOSIS — Z20822 Contact with and (suspected) exposure to covid-19: Secondary | ICD-10-CM | POA: Diagnosis present

## 2021-05-21 DIAGNOSIS — E872 Acidosis: Secondary | ICD-10-CM | POA: Diagnosis present

## 2021-05-21 DIAGNOSIS — E876 Hypokalemia: Secondary | ICD-10-CM | POA: Diagnosis present

## 2021-05-21 DIAGNOSIS — Z9049 Acquired absence of other specified parts of digestive tract: Secondary | ICD-10-CM | POA: Diagnosis not present

## 2021-05-21 DIAGNOSIS — R748 Abnormal levels of other serum enzymes: Secondary | ICD-10-CM | POA: Diagnosis not present

## 2021-05-21 DIAGNOSIS — Z833 Family history of diabetes mellitus: Secondary | ICD-10-CM | POA: Diagnosis not present

## 2021-05-21 DIAGNOSIS — S3613XA Injury of bile duct, initial encounter: Secondary | ICD-10-CM | POA: Diagnosis not present

## 2021-05-21 DIAGNOSIS — T85590A Other mechanical complication of bile duct prosthesis, initial encounter: Secondary | ICD-10-CM | POA: Diagnosis not present

## 2021-05-21 DIAGNOSIS — Z8616 Personal history of COVID-19: Secondary | ICD-10-CM | POA: Diagnosis not present

## 2021-05-21 DIAGNOSIS — K219 Gastro-esophageal reflux disease without esophagitis: Secondary | ICD-10-CM | POA: Diagnosis present

## 2021-05-21 DIAGNOSIS — R7303 Prediabetes: Secondary | ICD-10-CM | POA: Diagnosis present

## 2021-05-21 DIAGNOSIS — T8189XA Other complications of procedures, not elsewhere classified, initial encounter: Secondary | ICD-10-CM | POA: Diagnosis present

## 2021-05-21 DIAGNOSIS — Y838 Other surgical procedures as the cause of abnormal reaction of the patient, or of later complication, without mention of misadventure at the time of the procedure: Secondary | ICD-10-CM | POA: Diagnosis present

## 2021-05-21 DIAGNOSIS — G8918 Other acute postprocedural pain: Secondary | ICD-10-CM | POA: Diagnosis present

## 2021-05-21 DIAGNOSIS — Z4659 Encounter for fitting and adjustment of other gastrointestinal appliance and device: Secondary | ICD-10-CM | POA: Diagnosis not present

## 2021-05-21 DIAGNOSIS — K653 Choleperitonitis: Secondary | ICD-10-CM | POA: Diagnosis present

## 2021-05-21 DIAGNOSIS — R102 Pelvic and perineal pain: Secondary | ICD-10-CM | POA: Diagnosis not present

## 2021-05-21 DIAGNOSIS — Z8249 Family history of ischemic heart disease and other diseases of the circulatory system: Secondary | ICD-10-CM | POA: Diagnosis not present

## 2021-05-21 DIAGNOSIS — T85520A Displacement of bile duct prosthesis, initial encounter: Secondary | ICD-10-CM | POA: Diagnosis not present

## 2021-05-21 LAB — CBC
HCT: 37.2 % (ref 36.0–46.0)
Hemoglobin: 12.5 g/dL (ref 12.0–15.0)
MCH: 30.2 pg (ref 26.0–34.0)
MCHC: 33.6 g/dL (ref 30.0–36.0)
MCV: 89.9 fL (ref 80.0–100.0)
Platelets: 178 10*3/uL (ref 150–400)
RBC: 4.14 MIL/uL (ref 3.87–5.11)
RDW: 12.7 % (ref 11.5–15.5)
WBC: 11.4 10*3/uL — ABNORMAL HIGH (ref 4.0–10.5)
nRBC: 0 % (ref 0.0–0.2)

## 2021-05-21 LAB — COMPREHENSIVE METABOLIC PANEL
ALT: 17 U/L (ref 0–44)
AST: 15 U/L (ref 15–41)
Albumin: 3.5 g/dL (ref 3.5–5.0)
Alkaline Phosphatase: 39 U/L (ref 38–126)
Anion gap: 8 (ref 5–15)
BUN: 6 mg/dL (ref 6–20)
CO2: 21 mmol/L — ABNORMAL LOW (ref 22–32)
Calcium: 8.5 mg/dL — ABNORMAL LOW (ref 8.9–10.3)
Chloride: 110 mmol/L (ref 98–111)
Creatinine, Ser: 0.45 mg/dL (ref 0.44–1.00)
GFR, Estimated: >60 mL/min (ref >60)
Glucose, Bld: 125 mg/dL — ABNORMAL HIGH (ref 70–99)
Potassium: 3.9 mmol/L (ref 3.5–5.1)
Sodium: 139 mmol/L (ref 135–145)
Total Bilirubin: 1.2 mg/dL (ref 0.3–1.2)
Total Protein: 5.9 g/dL — ABNORMAL LOW (ref 6.5–8.1)

## 2021-05-21 LAB — URINE CULTURE

## 2021-05-21 LAB — SARS CORONAVIRUS 2 (TAT 6-24 HRS): SARS Coronavirus 2: NEGATIVE

## 2021-05-21 LAB — LACTIC ACID, PLASMA
Lactic Acid, Venous: 0.5 mmol/L (ref 0.5–1.9)
Lactic Acid, Venous: 0.8 mmol/L (ref 0.5–1.9)

## 2021-05-21 MED ORDER — ORAL CARE MOUTH RINSE
15.0000 mL | Freq: Two times a day (BID) | OROMUCOSAL | Status: DC
  Administered 2021-05-21 – 2021-05-30 (×16): 15 mL via OROMUCOSAL

## 2021-05-21 MED ORDER — SODIUM CHLORIDE 0.9 % IV SOLN: INTRAVENOUS | Status: DC

## 2021-05-21 MED ORDER — ENOXAPARIN SODIUM 40 MG/0.4ML IJ SOSY: 40.0000 mg | PREFILLED_SYRINGE | INTRAMUSCULAR | Status: DC

## 2021-05-21 MED ORDER — HYDROMORPHONE HCL 1 MG/ML IJ SOLN
1.0000 mg | INTRAMUSCULAR | Status: DC | PRN
  Administered 2021-05-21 – 2021-05-22 (×11): 1 mg via INTRAVENOUS
  Filled 2021-05-21 (×11): qty 1

## 2021-05-21 MED ORDER — TECHNETIUM TC 99M MEBROFENIN IV KIT
5.2700 | PACK | Freq: Once | INTRAVENOUS | Status: AC
  Administered 2021-05-21: 5.27 via INTRAVENOUS

## 2021-05-21 NOTE — Consult Note (Signed)
Referring Provider: Dr. Harriette Bouillon Primary Care Physician:  Patient, No Pcp Per (Inactive) Primary Gastroenterologist:  None  Reason for Consultation:  Bile leak  HPI: Cassandra Peters is a 40 y.o. female 6 days s/p cholecystectomy with Dr. Sheliah Hatch presents for bile leak.  Arabic translator was used to communicate.  Patient said since she has had 3-5 years of colicky abdominal pain with meats and breads. Status post cholecystectomy on 09/06. Patient had acute abdominal pain right upper quadrant/ epigastric pain on 09/10 with nausea and vomiting.  Denies fever or chills. States she has not been able to see her urine or stool, denies dark urine, blood in stool. Last bowel movement 2 days prior, patient is passing very little to no gas. Denies diarrhea.  Patient is not on blood thinners.  HIDA was positive for bile leak.  WBC is 15,500--> 11.4, lactic acidosis has resolved.  Normal liver functions, normal total bilirubin.  CT AB AND PELVIS: IMPRESSION: Postsurgical changes related to recent cholecystectomy. Small volume pelvic ascites. No free air. Common duct measures 12 mm and smoothly tapers at the ampulla. In the setting of normal LFTs, this appearance is likely postsurgical.  HIDA IMPRESSION: 1. Positive for bile leak, with radiopharmaceutical preferentially tracking down the right paracolic gutter.  Past Medical History:  Diagnosis Date   COVID-19 03/2021   Dyspnea    During Covid in 03/2021   GERD (gastroesophageal reflux disease)    Gestational diabetes    Pre-diabetes     Past Surgical History:  Procedure Laterality Date   CHOLECYSTECTOMY N/A 05/15/2021   Procedure: LAPAROSCOPIC CHOLECYSTECTOMY;  Surgeon: Kinsinger, De Blanch, MD;  Location: [facility];  Service: General;  Laterality: N/A;   NO PAST SURGERIES      Prior to Admission medications   Medication Sig Start Date End Date Taking? Authorizing Provider  cholecalciferol (VITAMIN D3) 25 MCG (1000 UNIT) tablet  Take 1,000 Units by mouth daily.   Yes [provider]  ibuprofen (ADVIL) 800 MG tablet Take 1 tablet (800 mg total) by mouth every 8 (eight) hours as needed. 05/15/21  Yes Kinsinger, De Blanch, MD  oxyCODONE (OXY IR/ROXICODONE) 5 MG immediate release tablet Take 1 tablet (5 mg total) by mouth every 6 (six) hours as needed for severe pain. 05/15/21  Yes Kinsinger, De Blanch, MD  vitamin B-12 (CYANOCOBALAMIN) 1000 MCG tablet Take 1,000 mcg by mouth daily.   Yes [provider]    Scheduled Meds:  enoxaparin (LOVENOX) injection  40 mg Subcutaneous Q24H   mouth rinse  15 mL Mouth Rinse BID   Continuous Infusions:  0.9 % NaCl with KCl 20 mEq / L 125 mL/hr at 05/21/21 0709   PRN Meds:.HYDROmorphone (DILAUDID) injection, ondansetron **OR** ondansetron (ZOFRAN) IV  Allergies as of 05/20/2021   (No Known Allergies)    Family History  Problem Relation Age of Onset   Diabetes Mother    Hypertension Mother     Social History   Socioeconomic History   Marital status: Married    Spouse name: Not on file   Number of children: Not on file   Years of education: Not on file   Highest education level: Not on file  Occupational History   Not on file  Tobacco Use   Smoking status: Never   Smokeless tobacco: Never  Vaping Use   Vaping Use: Never used  Substance and Sexual Activity   Alcohol use: No   Drug use: No   Sexual activity: Yes  Partners: Male    Birth control/protection: None  Other Topics Concern   Not on file  Social History Narrative   Married   From [place] - arrived in [place] 04/06/2016   Social Determinants of Health   Financial Resource Strain: Not on file  Food Insecurity: Not on file  Transportation Needs: Not on file  Physical Activity: Not on file  Stress: Not on file  Social Connections: Not on file  Intimate Partner Violence: Not on file    Review of Systems:  Review of Systems  Constitutional:  Negative for chills and fever.  Eyes:   Negative for redness.  Respiratory:  Negative for shortness of breath.   Cardiovascular:  Negative for chest pain.  Gastrointestinal:  Positive for abdominal pain, constipation, nausea and vomiting. Negative for blood in stool, diarrhea, heartburn and melena.  Musculoskeletal:  Negative for falls.  Skin:  Negative for itching.  Neurological:  Positive for dizziness. Negative for focal weakness.  Psychiatric/Behavioral:  Negative for memory loss.     Physical Exam: Vital signs: Vitals:   05/21/21 0558 05/21/21 1100  BP: 112/79 117/85  Pulse: (!) 108 (!) 114  Resp: 20 20  Temp: 98.1 F (36.7 C) 98.5 F (36.9 C)  SpO2: 98% 97%   Last BM Date: 05/19/21 Physical Exam Constitutional:      Appearance: Normal appearance. She is normal weight.  HENT:     Head: Normocephalic.     Mouth/Throat:     Mouth: Mucous membranes are dry.     Comments: Dry cracking lips Eyes:     General: No scleral icterus.    Conjunctiva/sclera: Conjunctivae normal.  Cardiovascular:     Rate and Rhythm: Regular rhythm. Tachycardia present.     Pulses: Normal pulses.  Pulmonary:     Effort: Pulmonary effort is normal.     Breath sounds: Normal breath sounds. No wheezing.  Abdominal:     General: Bowel sounds are absent. There is no distension.     Palpations: Abdomen is soft. There is no mass.     Tenderness: There is abdominal tenderness in the right upper quadrant. There is no guarding or rebound.     Hernia: No hernia is present.     Comments: Well healing 4 surgical sites without erythema, discharge.     GI:  Lab Results: Recent Labs    05/20/21 0246 05/20/21 0311 05/21/21 0410  WBC 15.5*  --  11.4*  HGB 14.3 14.3 12.5  HCT 42.3 42.0 37.2  PLT 287  --  178   BMET Recent Labs    05/20/21 0246 05/20/21 0311 05/21/21 0410  NA 140 138 139  K 3.5 3.4* 3.9  CL 106 104 110  CO2 20*  --  21*  GLUCOSE 233* 230* 125*  BUN 13 10 6   CREATININE 0.64 0.60 0.45  CALCIUM 9.7  --  8.5*    LFT Recent Labs    05/21/21 0410  PROT 5.9*  ALBUMIN 3.5  AST 15  ALT 17  ALKPHOS 39  BILITOT 1.2   PT/INR Recent Labs    05/20/21 0246  LABPROT 13.3  INR 1.0    Studies/Results: CT ABDOMEN PELVIS W CONTRAST  Result Date: 05/20/2021 CLINICAL DATA:  Abdominal pain, cholecystectomy on 05/15/2021 EXAM: CT ABDOMEN AND PELVIS WITH CONTRAST TECHNIQUE: Multidetector CT imaging of the abdomen and pelvis was performed using the standard protocol following bolus administration of intravenous contrast. CONTRAST:  73mL OMNIPAQUE IOHEXOL 350 MG/ML SOLN COMPARISON:  Right upper  quadrant ultrasound dated 11/22/2020 FINDINGS: Lower chest: Lung bases are clear. Hepatobiliary: Liver is within normal limits. Status post cholecystectomy. Trace fluid/stranding along the gallbladder fossa and common duct (series 2/image 22), likely postsurgical. No intrahepatic ductal dilatation. Common duct measures 12 mm and smoothly tapers at the ampulla. Pancreas: Within normal limits. Spleen: Within normal limits. Adrenals/Urinary Tract: Adrenal glands are within normal limits. Kidneys are within normal limits.  No hydronephrosis. Layering excretory contrast in the bladder. Stomach/Bowel: Stomach is within normal limits. No evidence of bowel obstruction. Normal appendix (series 2/image 59). Left colon is decompressed. No colonic wall thickening or inflammatory changes. Vascular/Lymphatic: No evidence of abdominal aortic aneurysm. No suspicious abdominopelvic lymphadenopathy. Reproductive: Uterus is within normal limits. Bilateral ovaries are within normal limits. Other: Small volume pelvic ascites (series 2/image 72). No free air. Musculoskeletal: Visualized osseous structures are within normal limits. IMPRESSION: Postsurgical changes related to recent cholecystectomy. Small volume pelvic ascites. No free air. Common duct measures 12 mm and smoothly tapers at the ampulla. In the setting of normal LFTs, this appearance is  likely postsurgical. Electronically Signed   By: Charline Bills M.D.   On: 05/20/2021 04:07   NM HEPATOBILIARY LEAK (POST-SURGICAL)  Result Date: 05/21/2021 CLINICAL DATA:  Abdominal pain, prior cholecystectomy. EXAM: NUCLEAR MEDICINE HEPATOBILIARY IMAGING TECHNIQUE: Sequential images of the abdomen were obtained out to 60 minutes following intravenous administration of radiopharmaceutical. RADIOPHARMACEUTICALS:  5.3 mCi Tc-47m  Choletec IV COMPARISON:  CT scan 05/20/2021 FINDINGS: There is abnormal extension of radiopharmaceutical along the right paracolic gutter, indicating bile leak. There is only equivocal extension of the radiopharmaceutical into bowel. IMPRESSION: 1. Positive for bile leak, with radiopharmaceutical preferentially tracking down the right paracolic gutter. Electronically Signed   By: Gaylyn Rong M.D.   On: 05/21/2021 13:22    Impression   Post surgical hepatobiliary leak  HIDA positive for biliary leak  IR has been consulted for possible biloma drainage.   WBC is treading down 15.5-->11.8  Plan ERCP for tomorrow Clear liquids today, NPO at midnight.      LOS: 0 days   Doree Albee  PA-C 05/21/2021, 1:46 PM  Contact #  [phone]

## 2021-05-21 NOTE — Progress Notes (Signed)
Patient c/o pain 9/10 and received 4mg  IV morphine. 30 minutes later patient c/o pain 10/10. VSs retaken at this time. Notified Dr. 12/10. He d/c'd morphine and placed order for 1mg  IV diluadid PRN every hour instead. Administered IV diluadid to patient.   Dr. Luisa Hart also placed lab orders to be drawn now.    Will continue to monitor patient.

## 2021-05-21 NOTE — Progress Notes (Signed)
Request to IR for possible biloma aspiration/drain placement. Patient history and imaging have been reviewed by Dr. Lowella Dandy who notes the collection to be too small for safe percutaneous aspiration/drain placement at this time, recommend proceeding with ERCP/stent placement and follow up imaging in 1-2 days to reassess the size of the collection. Secure chat sent to [facility] with above information.  IR remains available as needed - please call with questions or concerns.  Lynnette Caffey, PA-C

## 2021-05-22 ENCOUNTER — Inpatient Hospital Stay (HOSPITAL_COMMUNITY): Payer: 59 | Admitting: Certified Registered Nurse Anesthetist

## 2021-05-22 ENCOUNTER — Encounter (HOSPITAL_COMMUNITY)
Admission: EM | Disposition: A | Discharge status: Home / Self Care | Payer: Self-pay | Source: Home / Self Care | Attending: General Surgery

## 2021-05-22 ENCOUNTER — Inpatient Hospital Stay (HOSPITAL_COMMUNITY): Payer: 59

## 2021-05-22 ENCOUNTER — Encounter (HOSPITAL_COMMUNITY): Payer: Self-pay | Admitting: Surgery

## 2021-05-22 HISTORY — PX: SPHINCTEROTOMY: SHX5544

## 2021-05-22 HISTORY — PX: PANCREATIC STENT PLACEMENT: SHX5539

## 2021-05-22 HISTORY — PX: CHOLECYSTECTOMY: SHX55

## 2021-05-22 HISTORY — PX: ENDOSCOPIC RETROGRADE CHOLANGIOPANCREATOGRAPHY (ERCP) WITH PROPOFOL: SHX5810

## 2021-05-22 HISTORY — PX: INTRAOPERATIVE CHOLANGIOGRAM: SHX5230

## 2021-05-22 HISTORY — PX: BILIARY STENT PLACEMENT: SHX5538

## 2021-05-22 LAB — PREPARE RBC (CROSSMATCH)

## 2021-05-22 SURGERY — LAPAROSCOPIC CHOLECYSTECTOMY WITH INTRAOPERATIVE CHOLANGIOGRAM
Anesthesia: General | Site: Abdomen

## 2021-05-22 SURGERY — ENDOSCOPIC RETROGRADE CHOLANGIOPANCREATOGRAPHY (ERCP) WITH PROPOFOL
Anesthesia: General

## 2021-05-22 MED ORDER — SODIUM CHLORIDE 0.9 % IV BOLUS
1000.0000 mL | Freq: Once | INTRAVENOUS | Status: AC
  Administered 2021-05-22: 1000 mL via INTRAVENOUS

## 2021-05-22 MED ORDER — FENTANYL CITRATE (PF) 100 MCG/2ML IJ SOLN
INTRAMUSCULAR | Status: DC | PRN
  Administered 2021-05-22 (×2): 50 ug via INTRAVENOUS

## 2021-05-22 MED ORDER — PROCHLORPERAZINE EDISYLATE 10 MG/2ML IJ SOLN: 10.0000 mg | INTRAMUSCULAR | Status: DC | PRN

## 2021-05-22 MED ORDER — LACTATED RINGERS IV BOLUS: 1000.0000 mL | Freq: Once | INTRAVENOUS | Status: DC

## 2021-05-22 MED ORDER — DEXAMETHASONE SODIUM PHOSPHATE 10 MG/ML IJ SOLN
INTRAMUSCULAR | Status: DC | PRN
  Administered 2021-05-22: 10 mg via INTRAVENOUS

## 2021-05-22 MED ORDER — GLUCAGON HCL RDNA (DIAGNOSTIC) 1 MG IJ SOLR
INTRAMUSCULAR | Status: DC | PRN
  Administered 2021-05-22 (×3): .5 mg via INTRAVENOUS

## 2021-05-22 MED ORDER — LACTATED RINGERS IR SOLN
Status: DC | PRN
  Administered 2021-05-22: 4000 mL

## 2021-05-22 MED ORDER — KETAMINE HCL 10 MG/ML IJ SOLN
INTRAMUSCULAR | Status: AC
  Filled 2021-05-22: qty 1

## 2021-05-22 MED ORDER — DEXAMETHASONE SODIUM PHOSPHATE 10 MG/ML IJ SOLN
INTRAMUSCULAR | Status: AC
  Filled 2021-05-22: qty 1

## 2021-05-22 MED ORDER — PANTOPRAZOLE SODIUM 40 MG IV SOLR
40.0000 mg | Freq: Two times a day (BID) | INTRAVENOUS | Status: DC
  Administered 2021-05-23 – 2021-05-27 (×10): 40 mg via INTRAVENOUS
  Filled 2021-05-22 (×10): qty 40

## 2021-05-22 MED ORDER — OXYCODONE HCL 5 MG PO TABS: 10.0000 mg | ORAL_TABLET | ORAL | Status: DC | PRN

## 2021-05-22 MED ORDER — SUCCINYLCHOLINE CHLORIDE 200 MG/10ML IV SOSY
PREFILLED_SYRINGE | INTRAVENOUS | Status: DC | PRN
  Administered 2021-05-22: 140 mg via INTRAVENOUS

## 2021-05-22 MED ORDER — LIDOCAINE 2% (20 MG/ML) 5 ML SYRINGE
INTRAMUSCULAR | Status: DC | PRN
  Administered 2021-05-22: 60 mg via INTRAVENOUS

## 2021-05-22 MED ORDER — SUGAMMADEX SODIUM 200 MG/2ML IV SOLN
INTRAVENOUS | Status: DC | PRN
  Administered 2021-05-22: 200 mg via INTRAVENOUS

## 2021-05-22 MED ORDER — SUCCINYLCHOLINE CHLORIDE 200 MG/10ML IV SOSY
PREFILLED_SYRINGE | INTRAVENOUS | Status: DC | PRN
  Administered 2021-05-22: 120 mg via INTRAVENOUS

## 2021-05-22 MED ORDER — DOCUSATE SODIUM 100 MG PO CAPS
100.0000 mg | ORAL_CAPSULE | Freq: Two times a day (BID) | ORAL | Status: DC
  Filled 2021-05-22: qty 1

## 2021-05-22 MED ORDER — HYDROMORPHONE HCL 2 MG/ML IJ SOLN
INTRAMUSCULAR | Status: AC
  Filled 2021-05-22: qty 1

## 2021-05-22 MED ORDER — PROPOFOL 500 MG/50ML IV EMUL
INTRAVENOUS | Status: AC
  Filled 2021-05-22: qty 100

## 2021-05-22 MED ORDER — ROCURONIUM BROMIDE 10 MG/ML (PF) SYRINGE
PREFILLED_SYRINGE | INTRAVENOUS | Status: AC
  Filled 2021-05-22: qty 10

## 2021-05-22 MED ORDER — BUPIVACAINE-EPINEPHRINE 0.25% -1:200000 IJ SOLN
INTRAMUSCULAR | Status: AC
  Filled 2021-05-22: qty 1

## 2021-05-22 MED ORDER — LIDOCAINE 2% (20 MG/ML) 5 ML SYRINGE
INTRAMUSCULAR | Status: AC
  Filled 2021-05-22: qty 5

## 2021-05-22 MED ORDER — LACTATED RINGERS IV SOLN: INTRAVENOUS | Status: DC

## 2021-05-22 MED ORDER — CIPROFLOXACIN IN D5W 400 MG/200ML IV SOLN
INTRAVENOUS | Status: AC
  Filled 2021-05-22: qty 200

## 2021-05-22 MED ORDER — INDOMETHACIN 50 MG RE SUPP
RECTAL | Status: DC | PRN
  Administered 2021-05-22: 100 mg via RECTAL

## 2021-05-22 MED ORDER — SODIUM CHLORIDE (PF) 0.9 % IJ SOLN
INTRAMUSCULAR | Status: DC | PRN
  Administered 2021-05-22: 20 mL

## 2021-05-22 MED ORDER — ROCURONIUM BROMIDE 10 MG/ML (PF) SYRINGE
PREFILLED_SYRINGE | INTRAVENOUS | Status: DC | PRN
  Administered 2021-05-22: 20 mg via INTRAVENOUS

## 2021-05-22 MED ORDER — PROPOFOL 10 MG/ML IV BOLUS
INTRAVENOUS | Status: AC
  Filled 2021-05-22: qty 20

## 2021-05-22 MED ORDER — LACTATED RINGERS IV SOLN: INTRAVENOUS | Status: DC | PRN

## 2021-05-22 MED ORDER — MIDAZOLAM HCL 2 MG/2ML IJ SOLN
INTRAMUSCULAR | Status: AC
  Filled 2021-05-22: qty 2

## 2021-05-22 MED ORDER — METHOCARBAMOL 500 MG IVPB - SIMPLE MED
500.0000 mg | Freq: Four times a day (QID) | INTRAVENOUS | Status: DC | PRN
  Filled 2021-05-22: qty 50

## 2021-05-22 MED ORDER — GLUCAGON HCL RDNA (DIAGNOSTIC) 1 MG IJ SOLR
INTRAMUSCULAR | Status: AC
  Filled 2021-05-22: qty 1

## 2021-05-22 MED ORDER — ONDANSETRON HCL 4 MG/2ML IJ SOLN
INTRAMUSCULAR | Status: DC | PRN
  Administered 2021-05-22: 4 mg via INTRAVENOUS

## 2021-05-22 MED ORDER — LIDOCAINE 2% (20 MG/ML) 5 ML SYRINGE
INTRAMUSCULAR | Status: DC | PRN
  Administered 2021-05-22: 80 mg via INTRAVENOUS

## 2021-05-22 MED ORDER — PIPERACILLIN-TAZOBACTAM 3.375 G IVPB
3.3750 g | Freq: Three times a day (TID) | INTRAVENOUS | Status: DC
  Administered 2021-05-22 – 2021-05-30 (×22): 3.375 g via INTRAVENOUS
  Filled 2021-05-22 (×22): qty 50

## 2021-05-22 MED ORDER — ROCURONIUM BROMIDE 10 MG/ML (PF) SYRINGE
PREFILLED_SYRINGE | INTRAVENOUS | Status: DC | PRN
  Administered 2021-05-22 (×3): 10 mg via INTRAVENOUS
  Administered 2021-05-22: 40 mg via INTRAVENOUS

## 2021-05-22 MED ORDER — KETAMINE HCL 10 MG/ML IJ SOLN
INTRAMUSCULAR | Status: DC | PRN
  Administered 2021-05-22: 30 mg via INTRAVENOUS

## 2021-05-22 MED ORDER — CIPROFLOXACIN IN D5W 400 MG/200ML IV SOLN
INTRAVENOUS | Status: DC | PRN
  Administered 2021-05-22: 400 mg via INTRAVENOUS

## 2021-05-22 MED ORDER — HYDROMORPHONE HCL 1 MG/ML IJ SOLN
INTRAMUSCULAR | Status: DC | PRN
  Administered 2021-05-22 (×2): 1 mg via INTRAVENOUS

## 2021-05-22 MED ORDER — PHENYLEPHRINE HCL-NACL 20-0.9 MG/250ML-% IV SOLN
INTRAVENOUS | Status: AC
  Filled 2021-05-22: qty 250

## 2021-05-22 MED ORDER — MIDAZOLAM HCL 5 MG/5ML IJ SOLN
INTRAMUSCULAR | Status: DC | PRN
  Administered 2021-05-22: 2 mg via INTRAVENOUS

## 2021-05-22 MED ORDER — 0.9 % SODIUM CHLORIDE (POUR BTL) OPTIME
TOPICAL | Status: DC | PRN
  Administered 2021-05-22: 1000 mL

## 2021-05-22 MED ORDER — FENTANYL CITRATE (PF) 250 MCG/5ML IJ SOLN
INTRAMUSCULAR | Status: AC
  Filled 2021-05-22: qty 5

## 2021-05-22 MED ORDER — FENTANYL CITRATE PF 50 MCG/ML IJ SOSY: 25.0000 ug | PREFILLED_SYRINGE | INTRAMUSCULAR | Status: DC | PRN

## 2021-05-22 MED ORDER — FENTANYL CITRATE (PF) 100 MCG/2ML IJ SOLN
INTRAMUSCULAR | Status: AC
  Filled 2021-05-22: qty 2

## 2021-05-22 MED ORDER — ACETAMINOPHEN 325 MG PO TABS
650.0000 mg | ORAL_TABLET | Freq: Four times a day (QID) | ORAL | Status: DC
  Administered 2021-05-23: 650 mg via ORAL
  Filled 2021-05-22: qty 2

## 2021-05-22 MED ORDER — INDOMETHACIN 50 MG RE SUPP
RECTAL | Status: AC
  Filled 2021-05-22: qty 2

## 2021-05-22 MED ORDER — OXYCODONE HCL 5 MG PO TABS: 5.0000 mg | ORAL_TABLET | ORAL | Status: DC | PRN

## 2021-05-22 MED ORDER — PROPOFOL 10 MG/ML IV BOLUS
INTRAVENOUS | Status: DC | PRN
Start: 2021-05-22 — End: 2021-05-22
  Administered 2021-05-22: 100 mg via INTRAVENOUS
  Administered 2021-05-22: 10 mg via INTRAVENOUS

## 2021-05-22 MED ORDER — SODIUM CHLORIDE 0.9% IV SOLUTION: Freq: Once | INTRAVENOUS | Status: DC

## 2021-05-22 MED ORDER — BUPIVACAINE-EPINEPHRINE 0.25% -1:200000 IJ SOLN
INTRAMUSCULAR | Status: DC | PRN
  Administered 2021-05-22: 13 mL

## 2021-05-22 MED ORDER — SODIUM CHLORIDE 0.9 % IV SOLN
INTRAVENOUS | Status: DC | PRN
  Administered 2021-05-22: 45 mL

## 2021-05-22 MED ORDER — ONDANSETRON HCL 4 MG/2ML IJ SOLN
INTRAMUSCULAR | Status: AC
  Filled 2021-05-22: qty 2

## 2021-05-22 MED ORDER — HYDROMORPHONE HCL 1 MG/ML IJ SOLN
1.0000 mg | INTRAMUSCULAR | Status: DC | PRN
  Administered 2021-05-23: 1 mg via INTRAVENOUS
  Filled 2021-05-22 (×2): qty 1

## 2021-05-22 MED ORDER — ALBUMIN HUMAN 5 % IV SOLN
INTRAVENOUS | Status: AC
  Filled 2021-05-22: qty 250

## 2021-05-22 MED ORDER — FENTANYL CITRATE (PF) 100 MCG/2ML IJ SOLN
INTRAMUSCULAR | Status: DC | PRN
  Administered 2021-05-22: 100 ug via INTRAVENOUS
  Administered 2021-05-22 (×2): 50 ug via INTRAVENOUS
  Administered 2021-05-22: 100 ug via INTRAVENOUS
  Administered 2021-05-22: 50 ug via INTRAVENOUS

## 2021-05-22 MED ORDER — PROMETHAZINE HCL 25 MG/ML IJ SOLN: 6.2500 mg | INTRAMUSCULAR | Status: DC | PRN

## 2021-05-22 MED ORDER — PROPOFOL 10 MG/ML IV BOLUS
INTRAVENOUS | Status: DC | PRN
  Administered 2021-05-22: 150 mg via INTRAVENOUS

## 2021-05-22 MED ORDER — ONDANSETRON HCL 4 MG/2ML IJ SOLN: 4.0000 mg | Freq: Four times a day (QID) | INTRAMUSCULAR | Status: DC | PRN

## 2021-05-22 MED ORDER — GABAPENTIN 300 MG PO CAPS
300.0000 mg | ORAL_CAPSULE | Freq: Three times a day (TID) | ORAL | Status: DC
  Filled 2021-05-22: qty 1

## 2021-05-22 MED ORDER — ALBUMIN HUMAN 5 % IV SOLN: INTRAVENOUS | Status: DC | PRN

## 2021-05-22 SURGICAL SUPPLY — 49 items
APPLIER CLIP 5 13 M/L LIGAMAX5 (MISCELLANEOUS) ×3
APPLIER CLIP ROT 10 11.4 M/L (STAPLE) ×3
BAG COUNTER SPONGE SURGICOUNT (BAG) IMPLANT
CABLE HIGH FREQUENCY MONO STRZ (ELECTRODE) ×3 IMPLANT
CATH URET 5FR 28IN OPEN ENDED (CATHETERS) ×3 IMPLANT
CHLORAPREP W/TINT 26 (MISCELLANEOUS) ×3 IMPLANT
CLIP APPLIE 5 13 M/L LIGAMAX5 (MISCELLANEOUS) ×2 IMPLANT
CLIP APPLIE ROT 10 11.4 M/L (STAPLE) ×2 IMPLANT
COVER MAYO STAND STRL (DRAPES) ×3 IMPLANT
COVER SURGICAL LIGHT HANDLE (MISCELLANEOUS) ×3 IMPLANT
DECANTER SPIKE VIAL GLASS SM (MISCELLANEOUS) ×3 IMPLANT
DERMABOND ADVANCED (GAUZE/BANDAGES/DRESSINGS) ×1
DERMABOND ADVANCED .7 DNX12 (GAUZE/BANDAGES/DRESSINGS) ×2 IMPLANT
DRAIN CHANNEL RND F F (WOUND CARE) ×6 IMPLANT
DRAPE C-ARM 42X120 X-RAY (DRAPES) ×3 IMPLANT
ELECT REM PT RETURN 15FT ADLT (MISCELLANEOUS) ×3 IMPLANT
ENDOLOOP SUT PDS II  0 18 (SUTURE) ×1
ENDOLOOP SUT PDS II 0 18 (SUTURE) ×2 IMPLANT
EVACUATOR SILICONE 100CC (DRAIN) ×6 IMPLANT
GAUZE 4X4 16PLY ~~LOC~~+RFID DBL (SPONGE) ×6 IMPLANT
GLOVE SRG 8 PF TXTR STRL LF DI (GLOVE) ×2 IMPLANT
GLOVE SURG ENC MOIS LTX SZ7.5 (GLOVE) ×3 IMPLANT
GLOVE SURG UNDER POLY LF SZ8 (GLOVE) ×1
GOWN STRL REUS W/TWL LRG LVL3 (GOWN DISPOSABLE) ×3 IMPLANT
GOWN STRL REUS W/TWL XL LVL3 (GOWN DISPOSABLE) ×6 IMPLANT
GRASPER SUT TROCAR 14GX15 (MISCELLANEOUS) IMPLANT
HEMOSTAT SNOW SURGICEL 2X4 (HEMOSTASIS) IMPLANT
IRRIG SUCT STRYKERFLOW 2 WTIP (MISCELLANEOUS) ×3
IRRIGATION SUCT STRKRFLW 2 WTP (MISCELLANEOUS) ×2 IMPLANT
IV CATH 14GX2 1/4 (CATHETERS) ×3 IMPLANT
KIT BASIN OR (CUSTOM PROCEDURE TRAY) ×3 IMPLANT
KIT TURNOVER KIT A (KITS) ×3 IMPLANT
NEEDLE INSUFFLATION 14GA 120MM (NEEDLE) ×3 IMPLANT
PENCIL SMOKE EVACUATOR (MISCELLANEOUS) IMPLANT
POUCH RETRIEVAL ECOSAC 10 (ENDOMECHANICALS) ×2 IMPLANT
POUCH RETRIEVAL ECOSAC 10MM (ENDOMECHANICALS) ×1
POUCH UROSTOMY 11/2 CONVEX (OSTOMY) ×6 IMPLANT
SCISSORS LAP 5X35 DISP (ENDOMECHANICALS) ×3 IMPLANT
SET TUBE SMOKE EVAC HIGH FLOW (TUBING) ×3 IMPLANT
SLEEVE XCEL OPT CAN 5 100 (ENDOMECHANICALS) ×9 IMPLANT
STOPCOCK 4 WAY LG BORE MALE ST (IV SETS) ×3 IMPLANT
SUT ETHILON 3 0 FSL (SUTURE) ×3 IMPLANT
SUT MNCRL AB 4-0 PS2 18 (SUTURE) ×6 IMPLANT
SUT PDS AB 4-0 SH 27 (SUTURE) ×3 IMPLANT
TOWEL OR 17X26 10 PK STRL BLUE (TOWEL DISPOSABLE) ×3 IMPLANT
TOWEL OR NON WOVEN STRL DISP B (DISPOSABLE) ×3 IMPLANT
TRAY LAPAROSCOPIC (CUSTOM PROCEDURE TRAY) ×3 IMPLANT
TROCAR BLADELESS OPT 5 100 (ENDOMECHANICALS) ×3 IMPLANT
TROCAR XCEL 12X100 BLDLESS (ENDOMECHANICALS) ×3 IMPLANT

## 2021-05-22 NOTE — Progress Notes (Signed)
Day of Surgery   Chief Complaint/Subjective: Nausea this morning  Review of Systems See above, otherwise other systems negative  PMH -  has a past medical history of COVID-19 (03/2021), Dyspnea, GERD (gastroesophageal reflux disease), Gestational diabetes, and Pre-diabetes. PSH - @ has a past surgical history that includes No past surgeries and Cholecystectomy (N/A, 05/15/2021).  [facility] - family history includes Diabetes in her mother; Hypertension in her mother.   Objective: Vital signs in last 24 hours: Temp:  [98.2 F (36.8 C)-99.4 F (37.4 C)] 98.5 F (36.9 C) (09/13 1000) Pulse Rate:  [79-125] 79 (09/13 1350) Resp:  [10-30] 10 (09/13 1350) BP: (121-145)/(73-104) 128/89 (09/13 1350) SpO2:  [92 %-100 %] 98 % (09/13 1350) Weight:  [65.8 kg] 65.8 kg (09/13 1000) Last BM Date: 05/19/21 Intake/Output from previous day: 09/12 0701 - 09/13 0700 In: 3111.2 [P.O.:520; I.V.:2591.2] Out: 1550 [Urine:1550] Intake/Output this shift: Total I/O In: 2800 [I.V.:2600; IV Piggyback:200] Out: -   PE: Gen: NAD Resp: nonlabored Card: RRR Abd: soft, incisions c/d/i  Lab Results:  Recent Labs    05/20/21 0246 05/20/21 0311 05/21/21 0410  WBC 15.5*  --  11.4*  HGB 14.3 14.3 12.5  HCT 42.3 42.0 37.2  PLT 287  --  178   BMET Recent Labs    05/20/21 0246 05/20/21 0311 05/21/21 0410  NA 140 138 139  K 3.5 3.4* 3.9  CL 106 104 110  CO2 20*  --  21*  GLUCOSE 233* 230* 125*  BUN 13 10 6   CREATININE 0.64 0.60 0.45  CALCIUM 9.7  --  8.5*   PT/INR Recent Labs    05/20/21 0246  LABPROT 13.3  INR 1.0   CMP     Component Value Date/Time   NA 139 05/21/2021 0410   NA 145 (H) 08/13/2016 1049   K 3.9 05/21/2021 0410   CL 110 05/21/2021 0410   CO2 21 (L) 05/21/2021 0410   GLUCOSE 125 (H) 05/21/2021 0410   BUN 6 05/21/2021 0410   BUN 12 08/13/2016 1049   CREATININE 0.45 05/21/2021 0410   CREATININE 0.59 05/14/2016 1227   CALCIUM 8.5 (L) 05/21/2021 0410   PROT 5.9 (L)  05/21/2021 0410   PROT 6.8 08/13/2016 1049   ALBUMIN 3.5 05/21/2021 0410   ALBUMIN 4.5 08/13/2016 1049   AST 15 05/21/2021 0410   ALT 17 05/21/2021 0410   ALKPHOS 39 05/21/2021 0410   BILITOT 1.2 05/21/2021 0410   BILITOT <0.2 08/13/2016 1049   GFRNONAA >60 05/21/2021 0410   GFRNONAA >89 05/14/2016 1227   GFRAA 141 08/13/2016 1049   GFRAA >89 05/14/2016 1227   Lipase     Component Value Date/Time   LIPASE 35 05/20/2021 0246    Studies/Results: NM HEPATOBILIARY LEAK (POST-SURGICAL)  Result Date: 05/21/2021 CLINICAL DATA:  Abdominal pain, prior cholecystectomy. EXAM: NUCLEAR MEDICINE HEPATOBILIARY IMAGING TECHNIQUE: Sequential images of the abdomen were obtained out to 60 minutes following intravenous administration of radiopharmaceutical. RADIOPHARMACEUTICALS:  5.3 mCi Tc-68m  Choletec IV COMPARISON:  CT scan 05/20/2021 FINDINGS: There is abnormal extension of radiopharmaceutical along the right paracolic gutter, indicating bile leak. There is only equivocal extension of the radiopharmaceutical into bowel. IMPRESSION: 1. Positive for bile leak, with radiopharmaceutical preferentially tracking down the right paracolic gutter. Electronically Signed   By: 07/20/2021 M.D.   On: 05/21/2021 13:22    Anti-infectives: Anti-infectives (From admission, onward)    Start     Dose/Rate Route Frequency Ordered Stop   05/20/21 0300  piperacillin-tazobactam (  ZOSYN) IVPB 3.375 g        3.375 g 100 mL/hr over 30 Minutes Intravenous  Once 05/20/21 0248 05/20/21 0326       Assessment/Plan  s/p Procedure(s): ENDOSCOPIC RETROGRADE CHOLANGIOPANCREATOGRAPHY (ERCP) WITH PROPOFOL PANCREATIC STENT PLACEMENT SPHINCTEROTOMY BILIARY STENT PLACEMENT 05/22/2021  Increase IV fluids FEN - clear liquids VTE - enoxaparin ID - abx Disposition - inpatient admission   LOS: 1 day   [facility] Surgery 05/22/2021, 1:59 PM Please see Amion for pager number during day hours  7:00am-4:30pm or 7:00am -11:30am on weekends

## 2021-05-22 NOTE — Transfer of Care (Signed)
Immediate Anesthesia Transfer of Care Note  Patient: Cassandra Peters  Procedure(s) Performed: ENDOSCOPIC RETROGRADE CHOLANGIOPANCREATOGRAPHY (ERCP) WITH PROPOFOL PANCREATIC STENT PLACEMENT SPHINCTEROTOMY BILIARY STENT PLACEMENT  Patient Location: PACU and Endoscopy Unit  Anesthesia Type:General  Level of Consciousness: drowsy  Airway & Oxygen Therapy: Patient Spontanous Breathing and Patient connected to face mask oxygen  Post-op Assessment: Report given to RN and Post -op Vital signs reviewed and stable  Post vital signs: Reviewed and stable  Last Vitals:  Vitals Value Taken Time  BP 133/78 05/22/21 1316  Temp    Pulse 109 05/22/21 1318  Resp 12 05/22/21 1318  SpO2 98 % 05/22/21 1318  Vitals shown include unvalidated device data.  Last Pain:  Vitals:   05/22/21 1316  TempSrc: Axillary  PainSc:       Patients Stated Pain Goal: 2 (05/22/21 1497)  Complications: No notable events documented.

## 2021-05-22 NOTE — Anesthesia Procedure Notes (Addendum)
Procedure Name: Intubation Date/Time: 05/22/2021 10:56 AM Performed by: Sudie Grumbling, CRNA Pre-anesthesia Checklist: Patient identified, Emergency Drugs available, Suction available and Patient being monitored Patient Re-evaluated:Patient Re-evaluated prior to induction Oxygen Delivery Method: Circle system utilized Preoxygenation: Pre-oxygenation with 100% oxygen Induction Type: IV induction Ventilation: Mask ventilation without difficulty Laryngoscope Size: Miller and 2 Grade View: Grade I Tube type: Oral Tube size: 7.0 mm Number of attempts: 1 Airway Equipment and Method: Stylet Placement Confirmation: ETT inserted through vocal cords under direct vision, positive ETCO2 and breath sounds checked- equal and bilateral Secured at: 22 cm Tube secured with: Tape Dental Injury: Teeth and Oropharynx as per pre-operative assessment

## 2021-05-22 NOTE — Transfer of Care (Signed)
Immediate Anesthesia Transfer of Care Note  Patient: Cassandra Peters  Procedure(s) Performed: Procedure(s): LAPAROSCOPIC REPAIR OF BILE LEAK AND ABDOMINAL WASHOUT (N/A) INTRAOPERATIVE CHOLANGIOGRAM (N/A)  Patient Location: PACU  Anesthesia Type:General  Level of Consciousness: Alert, Awake, Oriented  Airway & Oxygen Therapy: Patient Spontanous Breathing  Post-op Assessment: Report given to RN  Post vital signs: Reviewed and stable  Last Vitals:  Vitals:   05/22/21 1400 05/22/21 1521  BP: 134/89 129/86  Pulse:  81  Resp:  16  Temp: 37.3 C 36.8 C  SpO2: 98% 97%    Complications: No apparent anesthesia complications

## 2021-05-22 NOTE — Anesthesia Procedure Notes (Signed)
Procedure Name: Intubation Date/Time: 05/22/2021 8:33 PM Performed by: Gerald Leitz, CRNA Pre-anesthesia Checklist: Patient identified, Patient being monitored, Timeout performed, Emergency Drugs available and Suction available Patient Re-evaluated:Patient Re-evaluated prior to induction Oxygen Delivery Method: Circle system utilized Preoxygenation: Pre-oxygenation with 100% oxygen Induction Type: IV induction and Rapid sequence Ventilation: Mask ventilation without difficulty Laryngoscope Size: Mac and 3 Grade View: Grade I Tube type: Oral Tube size: 7.0 mm Number of attempts: 1 Airway Equipment and Method: Stylet Placement Confirmation: ETT inserted through vocal cords under direct vision, positive ETCO2 and breath sounds checked- equal and bilateral Secured at: 21 cm Tube secured with: Tape Dental Injury: Teeth and Oropharynx as per pre-operative assessment

## 2021-05-22 NOTE — Progress Notes (Signed)
Received a call from on call radiologist that the CBD stent is malpositioned.  CT shows retroperitoneal air, the distal end of CBD stent is noted in the duodenal lumen while the proximal end is along IVC inferior to hemidiaphragm.   Called Dr.Paul Stechschulte, on call surgeon and discussed the findings. He will evaluate the patient emergently and assess for surgery.

## 2021-05-22 NOTE — Op Note (Addendum)
[facility] Patient Name: Cassandra Peters Procedure Date: 05/22/2021 MRN: [id number] Attending MD: Kerin Salen , MD Date of Birth: 22-Dec-1980 CSN: [id number] Age: 40 Admit Type: Inpatient Procedure:                ERCP Indications:              Bile leak, post cholecystectomy Providers:                Kerin Salen, MD, Fransisca Connors, Lawson Radar,                            Technician, Helayne Seminole Referring MD:             CCS, Dr.Kinsinger Medicines:                Monitored Anesthesia Care Complications:            No immediate complications. Estimated blood loss:                            Minimal. Estimated Blood Loss:     Estimated blood loss was minimal. Procedure:                Pre-Anesthesia Assessment:                           - Prior to the procedure, a History and Physical                            was performed, and patient medications and                            allergies were reviewed. The patient's tolerance of                            previous anesthesia was also reviewed. The risks                            and benefits of the procedure and the sedation                            options and risks were discussed with the patient.                            All questions were answered, and informed consent                            was obtained. Prior Anticoagulants: The patient has                            taken no previous anticoagulant or antiplatelet                            agents. ASA Grade Assessment: II - A patient with                            mild  systemic disease. After reviewing the risks                            and benefits, the patient was deemed in                            satisfactory condition to undergo the procedure.                           After obtaining informed consent, the scope was                            passed under direct vision. Throughout the                            procedure, the patient's blood  pressure, pulse, and                            oxygen saturations were monitored continuously. The                            TJF-Q180V ([id number]) Olympus duodenoscope was                            introduced through the mouth, and used to inject                            contrast into and used to inject contrast into the                            dorsal pancreatic duct. The ERCP was accomplished                            without difficulty. The patient tolerated the                            procedure well. Scope In: Scope Out: Findings:      The scout film was normal. The esophagus was successfully intubated       under direct vision. The scope was advanced to a normal major papilla in       the descending duodenum without detailed examination of the pharynx,       larynx and associated structures, and upper GI tract. The upper GI tract       was grossly normal.      On multiple attempts the wire was noted to advance in he direction of       the pancreatic duct.      A 5 cm 4 Fr pancreatic stent was deployed in the pancreatic duct,       however, it got stuck in the duodenoscope and had to be withdrawn out       with the scope.      A 4 Fr 3 cm pancreatic duct was then placed successfully and after       multiple attempts at CBD canulation, the wire was finally noted to  advance in the direction of CBD.      The bile duct was deeply cannulated with the sphincterotome. Contrast       was injected. I personally interpreted the bile duct images.      The flow of contrast through the ducts was poor. Image quality was       suboptimal.      Contrast extended minimally to the main bile duct, ?possibly due to bile       leak.      A straight Roadrunner wire was passed into the biliary tree.      At this point, the pancreatic duct stent fell off spontaneously.      A 10 mm biliary sphincterotomy was made with a sphincterotome using ERBE       electrocautery. Moderate bleeding  from the sphincterotomy was noted       however it stopped within 5 minutes without any intervention.      Thereafter, the biliary tree was swept with a 12 mm balloon starting at       the bifurcation.      Surprisingly, the CBD could not be evaluated well despite attempts at       obstructive cholangiogram, it is unclear if this was related to bile       leak.      A decision was made to leave a plastic 7 cm 10 Fr CBD stent.      Dr.Magod was available during the procedure and helped with wire       placement and cholangiogram. Impression:               - A biliary sphincterotomy was performed.                           - The biliary tree was swept and nothing was found.                           - One plastic stent was placed into the common bile                            duct. Moderate Sedation:      No moderate sedation      Patient did not receive moderate sedation for this procedure, but       instead received monitored anesthesia care. Recommendation:           - Clear liquid diet.                           - Monitor for symptoms and advance diet as                            tolerated.                           - She will need CBD stent removal in 6-8 weeks,                            depending on clinical improvement from bile leak. Procedure Code(s):        --- Professional ---  97673, Endoscopic retrograde                            cholangiopancreatography (ERCP); with placement of                            endoscopic stent into biliary or pancreatic duct,                            including pre- and post-dilation and guide wire                            passage, when performed, including sphincterotomy,                            when performed, each stent Diagnosis Code(s):        --- Professional ---                           K83.8, Other specified diseases of biliary tract CPT copyright 2019 [employer] Association. All rights  reserved. The codes documented in this report are preliminary and upon coder review may  be revised to meet current compliance requirements. Kerin Salen, MD 05/22/2021 1:11:41 PM This report has been signed electronically. Number of Addenda: 0

## 2021-05-22 NOTE — Progress Notes (Signed)
Pharmacy Antibiotic Note  Cassandra Peters is a 40 y.o. female admitted on 05/20/2021 with bile leak (per HIDA scan) s/p lap cholecystectomy 9/6.  Pharmacy has been consulted for Zosyn dosing.  Plan: Zosyn 3.375g IV q8h (4 hour infusion).  Height: 5\' 7"  (170.2 cm) Weight: 65.8 kg (145 lb 1 oz) IBW/kg (Calculated) : 61.6  Temp (24hrs), Avg:98.8 F (37.1 C), Min:98.2 F (36.8 C), Max:99.4 F (37.4 C)  Recent Labs  Lab 05/20/21 0246 05/20/21 0311 05/20/21 0512 05/20/21 1605 05/21/21 0410 05/21/21 0655  WBC 15.5*  --   --   --  11.4*  --   CREATININE 0.64 0.60  --   --  0.45  --   LATICACIDVEN 2.6*  --  3.5* 1.1 0.5 0.8    Estimated Creatinine Clearance: 92.7 mL/min (by C-G formula based on SCr of 0.45 mg/dL).    No Known Allergies  Antimicrobials this admission: 9/11  Zosyn x1 9/13 Cipro x1 9/13 Zosyn EI  Dose adjustments this admission:  Microbiology results: 9/11 BCx x1: ngtd 9/11 UCx: mult sp-final   Thank you for allowing pharmacy to be a part of this patient's care.  11/11 PharmD 05/22/2021 2:06 PM

## 2021-05-22 NOTE — Anesthesia Preprocedure Evaluation (Signed)
Anesthesia Evaluation  Patient identified by MRN, date of birth, ID band Patient awake    Reviewed: Allergy & Precautions, NPO status , Patient's Chart, lab work & pertinent test results  History of Anesthesia Complications Negative for: history of anesthetic complications  Airway Mallampati: II  TM Distance: >3 FB Neck ROM: Full    Dental  (+) Dental Advisory Given, Teeth Intact,    Pulmonary neg shortness of breath, neg sleep apnea, neg COPD, neg recent URI,   tachypnea  breath sounds clear to auscultation       Cardiovascular negative cardio ROS   Rhythm:Regular Rate:Normal     Neuro/Psych negative neurological ROS  negative psych ROS   GI/Hepatic GERD  ,Lab Results      Component                Value               Date                      ALT                      17                  05/21/2021                AST                      15                  05/21/2021                ALKPHOS                  39                  05/21/2021                BILITOT                  1.2                 05/21/2021           Bile leak s/p cholecystectomy   Endo/Other  negative endocrine ROS  Renal/GU negative Renal ROSLab Results      Component                Value               Date                      CREATININE               0.45                05/21/2021           Lab Results      Component                Value               Date                      K                        3.9  05/21/2021                Musculoskeletal negative musculoskeletal ROS (+)   Abdominal   Peds  Hematology negative hematology ROS (+) Lab Results      Component                Value               Date                      WBC                      11.4 (H)            05/21/2021                HGB                      12.5                05/21/2021                HCT                      37.2                05/21/2021                 MCV                      89.9                05/21/2021                PLT                      178                 05/21/2021              Anesthesia Other Findings   Reproductive/Obstetrics Lab Results      Component                Value               Date                      PREGTESTUR               NEGATIVE            05/15/2021                HCG                      <5.0                05/20/2021                                        Anesthesia Physical Anesthesia Plan  ASA: 3  Anesthesia Plan: General   Post-op Pain Management:    Induction: Intravenous and Rapid sequence  PONV Risk Score and Plan: 3 and Ondansetron and Dexamethasone  Airway Management Planned: Oral ETT  Additional Equipment:   Intra-op Plan:   Post-operative Plan: Possible  Post-op intubation/ventilation  Informed Consent: I have reviewed the patients History and Physical, chart, labs and discussed the procedure including the risks, benefits and alternatives for the proposed anesthesia with the patient or authorized representative who has indicated his/her understanding and acceptance.     Dental advisory given  Plan Discussed with: Anesthesiologist and CRNA  Anesthesia Plan Comments:         Anesthesia Quick Evaluation

## 2021-05-22 NOTE — Anesthesia Preprocedure Evaluation (Signed)
Anesthesia Evaluation  Patient identified by MRN, date of birth, ID band Patient awake    Reviewed: Allergy & Precautions, NPO status , Patient's Chart, lab work & pertinent test results  Airway Mallampati: II  TM Distance: >3 FB     Dental   Pulmonary shortness of breath,    breath sounds clear to auscultation       Cardiovascular negative cardio ROS   Rhythm:Regular Rate:Normal     Neuro/Psych    GI/Hepatic GERD  ,History noted Dr. Chilton Si History noted Dr. Chilton Si   Endo/Other  diabetes  Renal/GU negative Renal ROS     Musculoskeletal   Abdominal   Peds  Hematology   Anesthesia Other Findings   Reproductive/Obstetrics                             Anesthesia Physical Anesthesia Plan  ASA: 3  Anesthesia Plan: General   Post-op Pain Management:    Induction: Intravenous  PONV Risk Score and Plan: 3 and Ondansetron, Dexamethasone and Midazolam  Airway Management Planned: Oral ETT  Additional Equipment:   Intra-op Plan:   Post-operative Plan: Possible Post-op intubation/ventilation  Informed Consent: I have reviewed the patients History and Physical, chart, labs and discussed the procedure including the risks, benefits and alternatives for the proposed anesthesia with the patient or authorized representative who has indicated his/her understanding and acceptance.     Dental advisory given  Plan Discussed with: CRNA and Anesthesiologist  Anesthesia Plan Comments:         Anesthesia Quick Evaluation

## 2021-05-22 NOTE — Op Note (Signed)
Patient: Cassandra Peters (Jan 28, 1981, [id number])  Date of Surgery: 05/20/2021 - 05/22/2021   Preoperative Diagnosis: Bile Leak, Malpositioned ERCP stent  Postoperative Diagnosis: Bile Leak, Malpositioned ERCP stent  Surgical Procedure:  LAPAROSCOPIC REPAIR OF BILE LEAK AND ABDOMINAL WASHOUT: [id number] INTRAOPERATIVE CHOLANGIOGRAM:   Intraperitoneal drain placement  Operative Team Members:  Surgeon(s) and Role:    * Rodarius Kichline, Hyman Hopes, MD - Primary   Anesthesiologist: Dorris Singh, MD CRNA: Basilio Cairo, CRNA   Anesthesia: General   Fluids:  Total I/O In: 2050 [I.V.:1800; IV Piggyback:250] Out: 90 [Urine:70; Blood:20]  Complications: None  Drains: Two  (19 Fr) Jackson-Pratt drain(s) with closed bulb suction. - Medial drain placed in the right pericolic gutter tracking down towards the pelvis. - Lateral drain placed in gallbladder fossa Urostomy appliances placed around drain entry site in attempt to protect skin from bile leakage around drainage catheter  Specimen: none  Disposition:  ICU - extubated and stable.  Plan of Care: Admit to inpatient     Indications for Procedure: Cassandra Peters is a 40 y.o. female who presented with a bile leak on HIDA scan 1 week after cholecystectomy.  She underwent difficult ERCP earlier today and the stent was found to be malpositioned on post ERCP CT scan.  It appeared the distal stent was in the duodenum, but the proximal stent was retroperitoneal near the IVC. I had an extensive conversation with the patient and the patient's husband with the assistance of the arabic translator.  I discribed this is a complicated surgical issues.  I reviewed the anatomy of the area with the patient.  I recommended laparoscopic abdominal washout with inspection of the gallbladder fossa and surgical clips, drain placement, possible cholangiogram, possible upper endoscopy with stent removal, possible open surgery, possible common bile duct exploration and  possible hepaticoenterostomy.  I described my main goal was to drain the abdomen and treat the bile peritonitis which I am afraid may quickly make her quite ill.  After a full discussion and all questions answered the patient granted consent to proceed.  Findings: Approximately 1 mm circular ductotomy located on the anterior portion of the common bile duct adjacent to the cystic duct takeoff repaired with one 4-0 PDS figure of 8 suture prior to cholangiogram.  Cholangiogram with flow of contrast down to the ampulla some debris in the duct, ERCP stent appears to be located in the pancreatic duct then traveling superiorly.  ERCP stent left in position, will discuss plans for removal with hepatobiliary specialist and gastroenterology team.  Does not appear ERCP stent caused significant injury to the common bile duct and it may be able to just be removed endoscopically.   Description of Procedure:    At the end of the case we reviewed the infection status of the case. Patient: Cassandra Peters Emergency General Surgery Service Patient Case: Emergent Infection Present At Time Of Surgery (PATOS):  Diffuse bile pancreatitis  On the date stated above the patient was taken the operating room suite and placed in supine position.  General endotracheal anesthesia was induced.  A timeout was completed verifying the correct patient, procedure, positioning, and equipment needed for the case.  The patient's abdomen was prepped and draped in usual sterile fashion.  I began by incising the previous incisions from a week ago and inserting four 5 mm trochars in each of these positions (umbilical, subxiphoid, and 2 right subcostal).  These were placed using the optical technique without any trauma the underlying viscera.  The abdomen  was inspected.  Everything was green and bile-stained within the abdomen.  There was bile in all 4 quadrants of the abdomen.  Suction irrigator was used to suction as much of this as possible.   The patient was put in reverse Trendelenburg and left side down position.  The liver was lifted superiorly using a Glassman grasper.  The gallbladder fossa was inspected.  I did not see any active leakage of bile or obvious duct of Luschka as a source of the bile leak.  I inspected the clips.  There were 4 Hem-o-lok clips on 2 structures.  The more superior structure appeared to be the artery.  A clip had fallen off the artery so the titanium 5 mm clip applier was used to apply 1 additional clip.  I then cautiously lifted presumed cystic duct stump.  I carefully dissected the cystic duct stump down to its insertion on the common bile duct.  I was able to safely mobilize the short cystic duct stump, so I felt it would be prudent to attempt a cholangiogram.  The hemoclips were removed.  The cholangiogram catheter was brought through the right upper quadrant.  It was inserted into the cystic duct stump and his clip was applied to hold in place.  Saline was flushed through the catheter and leaked around the cystic duct stump.  The original clip was removed and the catheter was again appropriately positioned and a clip was placed again.  This time I flushed the catheter with saline and a 1 mm round ductotomy on the anterior aspect of the common bile duct just to the right of the cystic duct insertion sprayed saline.  I took a 4-0 PDS suture onto the field, skied the needle, and placed 1 figure-of-eight suture to close this ductotomy.  I felt this would be safe as the common bile duct looked large enough for primary repair without stenosis and the defect was quite small.  With the PDS tied down, I again flushed the catheter with saline.  There was no leakage from the ductotomy repair.  We then moved the patient to cholangiogram positioning and brought the C-arm onto the field and shot a cholangiogram.  My intraoperative interpretation of the cholangiogram showed the cystic duct stump with the cholangiogram catheter in  position.  It appears there is some debris or gas in the common bile duct.  The common bile duct filled and tapered toward the ampulla.  The ERCP stent was in the duodenum and appeared to traverse the ampulla, but may be positioned in the pancreatic duct or through the wall of the duodenum as it does not travel up the duct, but appears to travel medial to the common bile duct.  With the cholangiogram complete, decision was made to leave the ERCP stent in place.  I feel it may be able to be removed endoscopically as this perforation appears contained to the retroperitoneum, and appears not to have caused a significant bile duct injury.  I then removed the clip holding the cholangiogram catheter placed and removed the cholangiogram catheter.  The cystic duct stump was quite short so 2-0 PDS Endoloop was used to secure the duct.  This tail was left along for identification during  possible future cases, please do not confuse this with the 4-0 PDS on the ductal repair nearby.  There did not appear to be significant leakage of bile filling the field anymore.  The abdomen was irrigated with 3 L of saline and suctioned clean.  Two 58 French round channel drains were brought through the lateral port sites.  The lateral drain was placed in the gallbladder fossa.  The medial drain was placed in the right paracolic gutter tracking down toward the pelvis.  Urostomy bags were placed over these drains to protect the skin from some bile that was already leaking around the outside of the drain.  The abdomen was desufflated.  The skin was closed with 4-0 Monocryl.  All sponge needle counts were correct at the end of this case.  Ivar Drape, MD General, Bariatric, & Minimally Invasive Surgery [facility], [place]

## 2021-05-22 NOTE — Progress Notes (Signed)
Received call from Dr. Marca Ancona after post ERCP CT scan demonstrated malpositioned CBD stent exiting duodenum and sitting in the retroperitoneum with retroperitoneal fluid and air.  Imaging with fluid collections throughout the abdomen, but no clear free peritoneal air. She has mild to moderate tenderness throughout the abdomen.  No peritoneal signs.  Vitals stable.  Mild leukocytosis earlier today.  I had a long discussion with the patient at the bedside with the assistance of the arabic interpreter regarding this complicated surgical problem.  I recommended laparoscopic washout with drain placement, possible cholangiogram, endoscopic removal of stent from ERCP, possible laparotomy with common bile duct exploration and possible hepaticoduodenostomy or hepaticojejunostomy.  We discussed that she may need staged procedure with washout and drainage today and additional definitive repair of the bile duct in the future if she fails to heal.  I will send a type and crossmatch in preparation for surgery.  We will proceed as soon as OR is available.  Quentin Ore, MD General, Bariatric and Minimally Invasive Surgery [facility], [place]

## 2021-05-23 ENCOUNTER — Encounter (HOSPITAL_COMMUNITY)
Admission: EM | Disposition: A | Discharge status: Home / Self Care | Payer: Self-pay | Source: Home / Self Care | Attending: General Surgery

## 2021-05-23 ENCOUNTER — Inpatient Hospital Stay (HOSPITAL_COMMUNITY): Payer: 59 | Admitting: Anesthesiology

## 2021-05-23 ENCOUNTER — Ambulatory Visit: Payer: Self-pay | Admitting: General Surgery

## 2021-05-23 ENCOUNTER — Encounter (HOSPITAL_COMMUNITY): Payer: Self-pay | Admitting: Surgery

## 2021-05-23 ENCOUNTER — Other Ambulatory Visit: Payer: Self-pay

## 2021-05-23 ENCOUNTER — Inpatient Hospital Stay (HOSPITAL_COMMUNITY): Payer: 59

## 2021-05-23 DIAGNOSIS — T819XXA Unspecified complication of procedure, initial encounter: Secondary | ICD-10-CM | POA: Diagnosis present

## 2021-05-23 HISTORY — PX: LAPAROTOMY: SHX154

## 2021-05-23 LAB — BASIC METABOLIC PANEL
Anion gap: 8 (ref 5–15)
BUN: 8 mg/dL (ref 6–20)
CO2: 23 mmol/L (ref 22–32)
Calcium: 8.1 mg/dL — ABNORMAL LOW (ref 8.9–10.3)
Chloride: 108 mmol/L (ref 98–111)
Creatinine, Ser: 0.37 mg/dL — ABNORMAL LOW (ref 0.44–1.00)
GFR, Estimated: >60 mL/min (ref >60)
Glucose, Bld: 163 mg/dL — ABNORMAL HIGH (ref 70–99)
Potassium: 3.8 mmol/L (ref 3.5–5.1)
Sodium: 139 mmol/L (ref 135–145)

## 2021-05-23 LAB — HEPATIC FUNCTION PANEL
ALT: 34 U/L (ref 0–44)
AST: 45 U/L — ABNORMAL HIGH (ref 15–41)
Albumin: 3 g/dL — ABNORMAL LOW (ref 3.5–5.0)
Alkaline Phosphatase: 53 U/L (ref 38–126)
Bilirubin, Direct: 0.5 mg/dL — ABNORMAL HIGH (ref 0.0–0.2)
Indirect Bilirubin: 0.9 mg/dL (ref 0.3–0.9)
Total Bilirubin: 1.4 mg/dL — ABNORMAL HIGH (ref 0.3–1.2)
Total Protein: 5.4 g/dL — ABNORMAL LOW (ref 6.5–8.1)

## 2021-05-23 LAB — CBC
HCT: 31.7 % — ABNORMAL LOW (ref 36.0–46.0)
Hemoglobin: 11.1 g/dL — ABNORMAL LOW (ref 12.0–15.0)
MCH: 30.9 pg (ref 26.0–34.0)
MCHC: 35 g/dL (ref 30.0–36.0)
MCV: 88.3 fL (ref 80.0–100.0)
Platelets: 173 10*3/uL (ref 150–400)
RBC: 3.59 MIL/uL — ABNORMAL LOW (ref 3.87–5.11)
RDW: 12.6 % (ref 11.5–15.5)
WBC: 9.4 10*3/uL (ref 4.0–10.5)
nRBC: 0 % (ref 0.0–0.2)

## 2021-05-23 LAB — MRSA NEXT GEN BY PCR, NASAL: MRSA by PCR Next Gen: NOT DETECTED

## 2021-05-23 LAB — PREPARE RBC (CROSSMATCH)

## 2021-05-23 LAB — LIPASE, BLOOD: Lipase: 1079 U/L — ABNORMAL HIGH (ref 11–51)

## 2021-05-23 SURGERY — LAPAROTOMY, EXPLORATORY
Anesthesia: General | Site: Esophagus

## 2021-05-23 MED ORDER — ROCURONIUM BROMIDE 100 MG/10ML IV SOLN
INTRAVENOUS | Status: DC | PRN
  Administered 2021-05-23: 5 mg via INTRAVENOUS

## 2021-05-23 MED ORDER — ONDANSETRON HCL 4 MG PO TABS: 4.0000 mg | ORAL_TABLET | Freq: Four times a day (QID) | ORAL | Status: DC | PRN

## 2021-05-23 MED ORDER — MIDAZOLAM HCL 5 MG/5ML IJ SOLN
INTRAMUSCULAR | Status: DC | PRN
  Administered 2021-05-23: 2 mg via INTRAVENOUS

## 2021-05-23 MED ORDER — PHENOL 1.4 % MT LIQD
1.0000 | OROMUCOSAL | Status: DC | PRN
  Filled 2021-05-23: qty 177

## 2021-05-23 MED ORDER — PROMETHAZINE HCL 25 MG/ML IJ SOLN: 6.2500 mg | INTRAMUSCULAR | Status: DC | PRN

## 2021-05-23 MED ORDER — ONDANSETRON HCL 4 MG/2ML IJ SOLN: 4.0000 mg | Freq: Four times a day (QID) | INTRAMUSCULAR | Status: DC | PRN

## 2021-05-23 MED ORDER — PROPOFOL 10 MG/ML IV BOLUS
INTRAVENOUS | Status: DC | PRN
  Administered 2021-05-23: 150 mg via INTRAVENOUS

## 2021-05-23 MED ORDER — GABAPENTIN 250 MG/5ML PO SOLN
300.0000 mg | Freq: Three times a day (TID) | ORAL | Status: DC
  Administered 2021-05-24 – 2021-05-25 (×5): 300 mg
  Filled 2021-05-23 (×8): qty 6

## 2021-05-23 MED ORDER — ONDANSETRON HCL 4 MG/2ML IJ SOLN
INTRAMUSCULAR | Status: DC | PRN
  Administered 2021-05-23: 4 mg via INTRAVENOUS

## 2021-05-23 MED ORDER — OXYCODONE HCL 5 MG PO TABS: 5.0000 mg | ORAL_TABLET | ORAL | Status: DC | PRN

## 2021-05-23 MED ORDER — SCOPOLAMINE 1 MG/3DAYS TD PT72
MEDICATED_PATCH | TRANSDERMAL | Status: AC
  Filled 2021-05-23: qty 1

## 2021-05-23 MED ORDER — CHLORHEXIDINE GLUCONATE CLOTH 2 % EX PADS
6.0000 | MEDICATED_PAD | Freq: Every day | CUTANEOUS | Status: DC
  Administered 2021-05-24 – 2021-05-30 (×6): 6 via TOPICAL

## 2021-05-23 MED ORDER — SUCCINYLCHOLINE CHLORIDE 200 MG/10ML IV SOSY
PREFILLED_SYRINGE | INTRAVENOUS | Status: DC | PRN
  Administered 2021-05-23: 120 mg via INTRAVENOUS

## 2021-05-23 MED ORDER — FENTANYL CITRATE (PF) 100 MCG/2ML IJ SOLN
INTRAMUSCULAR | Status: DC | PRN
  Administered 2021-05-23: 100 ug via INTRAVENOUS

## 2021-05-23 MED ORDER — CHLORHEXIDINE GLUCONATE 0.12 % MT SOLN
OROMUCOSAL | Status: AC
  Administered 2021-05-23: 15 mL
  Filled 2021-05-23: qty 15

## 2021-05-23 MED ORDER — FENTANYL CITRATE (PF) 250 MCG/5ML IJ SOLN
INTRAMUSCULAR | Status: AC
  Filled 2021-05-23: qty 5

## 2021-05-23 MED ORDER — ENOXAPARIN SODIUM 40 MG/0.4ML IJ SOSY
40.0000 mg | PREFILLED_SYRINGE | INTRAMUSCULAR | Status: DC
  Administered 2021-05-23 – 2021-05-29 (×7): 40 mg via SUBCUTANEOUS
  Filled 2021-05-23 (×7): qty 0.4

## 2021-05-23 MED ORDER — ACETAMINOPHEN 10 MG/ML IV SOLN
1000.0000 mg | Freq: Once | INTRAVENOUS | Status: AC
  Administered 2021-05-23: 1000 mg via INTRAVENOUS

## 2021-05-23 MED ORDER — HYDROMORPHONE HCL 1 MG/ML IJ SOLN: 0.2500 mg | INTRAMUSCULAR | Status: DC | PRN

## 2021-05-23 MED ORDER — OXYCODONE HCL 5 MG PO TABS: 10.0000 mg | ORAL_TABLET | ORAL | Status: DC | PRN

## 2021-05-23 MED ORDER — ACETAMINOPHEN 10 MG/ML IV SOLN
INTRAVENOUS | Status: AC
  Filled 2021-05-23: qty 100

## 2021-05-23 MED ORDER — MIDAZOLAM HCL 2 MG/2ML IJ SOLN
INTRAMUSCULAR | Status: AC
  Filled 2021-05-23: qty 2

## 2021-05-23 MED ORDER — DEXAMETHASONE SODIUM PHOSPHATE 10 MG/ML IJ SOLN
INTRAMUSCULAR | Status: DC | PRN
  Administered 2021-05-23: 10 mg via INTRAVENOUS

## 2021-05-23 MED ORDER — LIDOCAINE HCL (CARDIAC) PF 100 MG/5ML IV SOSY
PREFILLED_SYRINGE | INTRAVENOUS | Status: DC | PRN
  Administered 2021-05-23: 60 mg via INTRAVENOUS

## 2021-05-23 MED ORDER — ACETAMINOPHEN 160 MG/5ML PO SOLN
650.0000 mg | Freq: Four times a day (QID) | ORAL | Status: DC
  Administered 2021-05-24 – 2021-05-25 (×6): 650 mg
  Filled 2021-05-23 (×6): qty 20.3

## 2021-05-23 MED ORDER — DOCUSATE SODIUM 50 MG/5ML PO LIQD
100.0000 mg | Freq: Two times a day (BID) | ORAL | Status: DC
  Administered 2021-05-24 (×3): 100 mg
  Filled 2021-05-23 (×2): qty 10

## 2021-05-23 SURGICAL SUPPLY — 39 items
BAG COUNTER SPONGE SURGICOUNT (BAG) ×2 IMPLANT
CHLORAPREP W/TINT 26 (MISCELLANEOUS) ×2 IMPLANT
CLIP VESOCCLUDE LG 6/CT (CLIP) ×2 IMPLANT
CLIP VESOCCLUDE MED 6/CT (CLIP) ×2 IMPLANT
CLIP VESOCCLUDE SM WIDE 6/CT (CLIP) ×2 IMPLANT
COVER SURGICAL LIGHT HANDLE (MISCELLANEOUS) ×2 IMPLANT
DRAPE LAPAROSCOPIC ABDOMINAL (DRAPES) ×2 IMPLANT
DRAPE WARM FLUID 44X44 (DRAPES) ×2 IMPLANT
DRSG OPSITE POSTOP 4X10 (GAUZE/BANDAGES/DRESSINGS) IMPLANT
DRSG OPSITE POSTOP 4X8 (GAUZE/BANDAGES/DRESSINGS) IMPLANT
ELECT BLADE 6.5 EXT (BLADE) IMPLANT
ELECT CAUTERY BLADE 6.4 (BLADE) ×2 IMPLANT
ELECT REM PT RETURN 9FT ADLT (ELECTROSURGICAL) ×2
ELECTRODE REM PT RTRN 9FT ADLT (ELECTROSURGICAL) ×1 IMPLANT
GLOVE SURG POLYISO LF SZ7 (GLOVE) ×2 IMPLANT
GLOVE SURG UNDER POLY LF SZ7 (GLOVE) ×2 IMPLANT
GOWN STRL REUS W/ TWL LRG LVL3 (GOWN DISPOSABLE) ×2 IMPLANT
GOWN STRL REUS W/TWL LRG LVL3 (GOWN DISPOSABLE) ×2
HANDLE SUCTION POOLE (INSTRUMENTS) ×1 IMPLANT
KIT BASIN OR (CUSTOM PROCEDURE TRAY) ×2 IMPLANT
LIGASURE IMPACT 36 18CM CVD LR (INSTRUMENTS) IMPLANT
LOOP VESSEL MAXI BLUE (MISCELLANEOUS) IMPLANT
LOOP VESSEL MINI RED (MISCELLANEOUS) IMPLANT
NEEDLE 22X1 1/2 (OR ONLY) (NEEDLE) ×2 IMPLANT
PACK GENERAL/GYN (CUSTOM PROCEDURE TRAY) ×2 IMPLANT
PENCIL SMOKE EVACUATOR (MISCELLANEOUS) ×2 IMPLANT
SPONGE T-LAP 18X18 ~~LOC~~+RFID (SPONGE) IMPLANT
STAPLER VISISTAT 35W (STAPLE) ×2 IMPLANT
SUCTION POOLE HANDLE (INSTRUMENTS) ×2
SUT PDS AB 1 TP1 96 (SUTURE) IMPLANT
SUT SILK 2 0 (SUTURE) ×1
SUT SILK 2 0 SH CR/8 (SUTURE) ×2 IMPLANT
SUT SILK 2-0 18XBRD TIE 12 (SUTURE) ×1 IMPLANT
SUT SILK 3 0 (SUTURE) ×1
SUT SILK 3 0 SH CR/8 (SUTURE) ×2 IMPLANT
SUT SILK 3-0 18XBRD TIE 12 (SUTURE) ×1 IMPLANT
TOWEL GREEN STERILE (TOWEL DISPOSABLE) ×2 IMPLANT
TRAY FOLEY MTR SLVR 16FR STAT (SET/KITS/TRAYS/PACK) ×2 IMPLANT
YANKAUER SUCT BULB TIP NO VENT (SUCTIONS) IMPLANT

## 2021-05-23 NOTE — Plan of Care (Signed)
  Problem: Education: Goal: Knowledge of General Education information will improve Description: Including pain rating scale, medication(s)/side effects and non-pharmacologic comfort measures Outcome: Progressing   Problem: Nutrition: Goal: Adequate nutrition will be maintained Outcome: Not Progressing   Problem: Coping: Goal: Level of anxiety will decrease Outcome: Progressing   Problem: Pain Managment: Goal: General experience of comfort will improve Outcome: Progressing

## 2021-05-23 NOTE — Progress Notes (Addendum)
Subjective: Patient denies abdominal pain, remains hemodynamically stable and afebrile. Has had a good urine output overnight.   Objective: Vital signs in last 24 hours: Temp:  [97.9 F (36.6 C)-99.1 F (37.3 C)] 98.1 F (36.7 C) (09/14 0329) Pulse Rate:  [68-119] 81 (09/14 0132) Resp:  [10-30] 23 (09/14 0132) BP: (116-141)/(72-89) 121/72 (09/14 0132) SpO2:  [95 %-100 %] 99 % (09/14 0132) Weight:  [65.8 kg] 65.8 kg (09/13 1000) Weight change:  Last BM Date: 05/19/21  PE: Lying on bed, not in distress GENERAL: No pallor, no icterus  ABDOMEN: 2 drains noted on right side of the abdomen draining bilious fluid, relatively soft abdomen, sluggish bowel sounds EXTREMITIES: No deformity, no edema  Lab Results: Results for orders placed or performed during the hospital encounter of 05/20/21 (from the past 48 hour(s))  Type and screen [facility]     Status: None (Preliminary result)   Collection Time: 05/22/21  6:52 PM  Result Value Ref Range   ABO/RH(D) O POS    Antibody Screen NEG    Sample Expiration 05/25/2021,2359    Unit Number [id number]    Blood Component Type RBC LR PHER1    Unit division 00    Status of Unit ALLOCATED    Transfusion Status OK TO TRANSFUSE    Crossmatch Result Compatible    Unit Number [id number]    Blood Component Type RED CELLS,LR    Unit division 00    Status of Unit ALLOCATED    Transfusion Status OK TO TRANSFUSE    Crossmatch Result      Compatible Performed at [facility], [address], [place], [place] [address]   Prepare RBC (crossmatch)     Status: None   Collection Time: 05/22/21  8:00 PM  Result Value Ref Range   Order Confirmation      ORDER PROCESSED BY BLOOD BANK Performed at [facility], [address], [place], [place] [address]   MRSA Next Gen by PCR, Nasal     Status: None   Collection Time: 05/23/21  1:32 AM   Specimen: Nasal Mucosa; Nasal Swab  Result Value  Ref Range   MRSA by PCR Next Gen NOT DETECTED NOT DETECTED    Comment: (NOTE) The GeneXpert MRSA Assay (FDA approved for NASAL specimens only), is one component of a comprehensive MRSA colonization surveillance program. It is not intended to diagnose MRSA infection nor to guide or monitor treatment for MRSA infections. Test performance is not FDA approved in patients less than 76 years old. Performed at [facility], [address] [place], [place] [address]   Basic metabolic panel     Status: Abnormal   Collection Time: 05/23/21  2:47 AM  Result Value Ref Range   Sodium 139 135 - 145 mmol/L   Potassium 3.8 3.5 - 5.1 mmol/L   Chloride 108 98 - 111 mmol/L   CO2 23 22 - 32 mmol/L   Glucose, Bld 163 (H) 70 - 99 mg/dL    Comment: Glucose reference range applies only to samples taken after fasting for at least 8 hours.   BUN 8 6 - 20 mg/dL   Creatinine, Ser 7.65 (L) 0.44 - 1.00 mg/dL   Calcium 8.1 (L) 8.9 - 10.3 mg/dL   GFR, Estimated >46 >50 mL/min    Comment: (NOTE) Calculated using the CKD-EPI Creatinine Equation (2021)    Anion gap 8 5 - 15    Comment: Performed at [facility], [address], [place], [place] [address]  Hepatic function panel     Status: Abnormal   Collection Time: 05/23/21  2:47 AM  Result Value Ref Range   Total Protein 5.4 (L) 6.5 - 8.1 g/dL   Albumin 3.0 (L) 3.5 - 5.0 g/dL   AST 45 (H) 15 - 41 U/L   ALT 34 0 - 44 U/L   Alkaline Phosphatase 53 38 - 126 U/L   Total Bilirubin 1.4 (H) 0.3 - 1.2 mg/dL   Bilirubin, Direct 0.5 (H) 0.0 - 0.2 mg/dL   Indirect Bilirubin 0.9 0.3 - 0.9 mg/dL    Comment: Performed at [facility], [address] [place], [place] [address]  Lipase, blood     Status: Abnormal   Collection Time: 05/23/21  2:47 AM  Result Value Ref Range   Lipase 1,079 (H) 11 - 51 U/L    Comment: RESULTS CONFIRMED BY MANUAL DILUTION Performed at [facility], [address], [place], [place] [address]   CBC     Status: Abnormal   Collection Time: 05/23/21  2:47 AM  Result Value Ref Range   WBC 9.4 4.0 - 10.5 K/uL   RBC 3.59 (L) 3.87 - 5.11 MIL/uL   Hemoglobin 11.1 (L) 12.0 - 15.0 g/dL   HCT 51.0 (L) 25.8 - 52.7 %   MCV 88.3 80.0 - 100.0 fL   MCH 30.9 26.0 - 34.0 pg   MCHC 35.0 30.0 - 36.0 g/dL   RDW 78.2 42.3 - 53.6 %   Platelets 173 150 - 400 K/uL   nRBC 0.0 0.0 - 0.2 %    Comment: Performed at [facility], [address] [place], [place] [address]    Studies/Results: CT ABDOMEN WO CONTRAST  Result Date: 05/22/2021 CLINICAL DATA:  Abdominal pain in a patient. Biliary obstruction suspected. Assess common bile duct stent posterior CP. Cholecystectomy on 05/15/2021 complicated by bile leak. EXAM: CT ABDOMEN WITHOUT CONTRAST TECHNIQUE: Multidetector CT imaging of the abdomen was performed following the standard protocol without IV contrast. COMPARISON:  CT abdomen pelvis 05/20/2021. FINDINGS: Lower chest: Pneumomediastinum noted. Hepatobiliary: No focal liver abnormality. No gallstones, gallbladder wall thickening, or pericholecystic fluid. Limited evaluation for biliary dilatation on this noncontrast study. The common bile duct stent proximal tip is noted along the inferior vena cava just inferior to the hemidiaphragm. The distal tip of the common bile duct stent is noted within the lumen of the second portion of the duodenum. No pneumobilia is noted. Pancreas: No focal lesion. Normal pancreatic contour. No surrounding inflammatory changes. No main pancreatic ductal dilatation. Spleen: Normal in size without focal abnormality. Adrenals/Urinary Tract: The right adrenal gland is not well visualized. The left adrenal gland demonstrates no nodularity. No nephrolithiasis and no hydronephrosis. No definite contour-deforming renal mass. No ureterolithiasis or hydroureter of the visualized ureters Stomach/Bowel: PO contrast is noted within a  short loop of small bowel within the left mid abdomen. Stomach is dilated with gas and fluid and is within normal limits. No evidence of bowel wall thickening or dilatation. Vascular/Lymphatic: No abdominal aortaaneurysm. No abdominal lymphadenopathy. Other: Small volume free fluid ascites. Small to moderate volume scattered foci of gas throughout the retroperitoneum. No definite pneumoperitoneum. Hyperdensity within the retroperitoneum likely related to extravasated contrast during ERCP (2:53). No organized fluid collection. Musculoskeletal: No abdominal wall hernia or abnormality. No suspicious lytic or blastic osseous lesions. No acute displaced fracture. IMPRESSION: 1. Moderate volume of free gas within  the retroperitoneum as well as pneumomediastinum with malpositioned common bile duct stent. The common bile duct stent is noted within the second portion of the duodenum lumen from where it courses through the duodenal wall with the proximal tip is noted along the inferior vena cava just inferior to the hemidiaphragm. Hyperdensity within the mid abdomen retroperitoneum likely related to extravasated contrast during ERCP. Marked limited evaluation on this noncontrast study. Given associated duodenal perforation through the course of the common bile duct and injury to the inferior vena cava not excluded, recommend emergent surgical consultation. 2. Poor evaluation of a known bile leak/gallbladder fossa on this noncontrast study. 3. At least small volume ascites. 4. The right adrenal gland is not well visualized. 5. Stomach is dilated with gas and fluid. Consider enteric tube placement. These results were called by telephone at the time of interpretation on 05/22/2021 at 5:30 pm to provider Kerin Salen , who verbally acknowledged these results. Electronically Signed   By: Tish Frederickson M.D.   On: 05/22/2021 17:42   DG ERCP  Result Date: 05/22/2021 CLINICAL DATA:  Bile leak following cholecystectomy EXAM: ERCP  TECHNIQUE: Multiple spot images obtained with the fluoroscopic device and submitted for interpretation post-procedure. FLUOROSCOPY TIME:  Fluoroscopy Time:  16 minutes 46 seconds COMPARISON:  Nuclear medicine HIDA scan 05/21/2021 FINDINGS: A total of 11 intraoperative spot images are submitted for review. The images demonstrate a flexible duodenal scope in the descending duodenum with initial wire cannulation of the main pancreatic duct followed by cannulation of the common bile duct. On subsequent images, contrast material is visualized extravasated into the peritoneal space. On the final image, a plastic biliary stent has been placed. IMPRESSION: 1. Bile leak. 2. Placement of plastic biliary stent. These images were submitted for radiologic interpretation only. Please see the procedural report for the amount of contrast and the fluoroscopy time utilized. Electronically Signed   By: Malachy Moan M.D.   On: 05/22/2021 15:36   DG C-Arm 1-60 Min-No Report  Result Date: 05/22/2021 Fluoroscopy was utilized by the requesting physician.  No radiographic interpretation.   NM HEPATOBILIARY LEAK (POST-SURGICAL)  Result Date: 05/21/2021 CLINICAL DATA:  Abdominal pain, prior cholecystectomy. EXAM: NUCLEAR MEDICINE HEPATOBILIARY IMAGING TECHNIQUE: Sequential images of the abdomen were obtained out to 60 minutes following intravenous administration of radiopharmaceutical. RADIOPHARMACEUTICALS:  5.3 mCi Tc-23m  Choletec IV COMPARISON:  CT scan 05/20/2021 FINDINGS: There is abnormal extension of radiopharmaceutical along the right paracolic gutter, indicating bile leak. There is only equivocal extension of the radiopharmaceutical into bowel. IMPRESSION: 1. Positive for bile leak, with radiopharmaceutical preferentially tracking down the right paracolic gutter. Electronically Signed   By: Gaylyn Rong M.D.   On: 05/21/2021 13:22    Medications: I have reviewed the patient's current  medications.  Assessment: Bile leak, biliary peritonitis, status post laparoscopic repair of bile leak and abdominal washout and intraoperative cholangiogram Operative findings discussed with Dr. Ivar Drape Biliary stent malposition noted Elevated lipase of 1079   Plan: If okay with surgical team, plan EGD with removal of malpositioned biliary stent today. Continue IV fluid resuscitation with 0.9% normal saline with 20 M EQ KCl at 125 mL/h Hold a.m. dose of Lovenox. I had a detailed talk with her husband Mr.Guendouza Karolee Ohs over the phone [phone]. He verbalized understanding and consents.  Kerin Salen, MD 05/23/2021, 7:30 AM

## 2021-05-23 NOTE — Anesthesia Procedure Notes (Signed)
Procedure Name: Intubation Date/Time: 05/23/2021 2:49 PM Performed by: Jonna Munro, CRNA Pre-anesthesia Checklist: Patient identified, Emergency Drugs available, Suction available, Patient being monitored and Timeout performed Patient Re-evaluated:Patient Re-evaluated prior to induction Oxygen Delivery Method: Circle system utilized Preoxygenation: Pre-oxygenation with 100% oxygen Induction Type: IV induction Laryngoscope Size: Mac and 3 Grade View: Grade I Tube type: Oral Tube size: 7.0 mm Number of attempts: 1 Airway Equipment and Method: Stylet Placement Confirmation: breath sounds checked- equal and bilateral, ETT inserted through vocal cords under direct vision and positive ETCO2 Secured at: 21 cm Tube secured with: Tape Dental Injury: Teeth and Oropharynx as per pre-operative assessment

## 2021-05-23 NOTE — Transfer of Care (Signed)
Immediate Anesthesia Transfer of Care Note  Patient: Cassandra Peters  Procedure(s) Performed: INTRAOPERATIVE ENDOSCOPY (Esophagus)  Patient Location: PACU  Anesthesia Type:General  Level of Consciousness: awake, alert , oriented and patient cooperative  Airway & Oxygen Therapy: Patient Spontanous Breathing and Patient connected to face mask  Post-op Assessment: Report given to RN, Post -op Vital signs reviewed and stable and Patient moving all extremities X 4  Post vital signs: Reviewed and stable  Last Vitals:  Vitals Value Taken Time  BP    Temp    Pulse 106 05/23/21 1542  Resp 33 05/23/21 1542  SpO2 95 % 05/23/21 1542  Vitals shown include unvalidated device data.  Last Pain:  Vitals:   05/23/21 1408  TempSrc:   PainSc: 0-No pain      Patients Stated Pain Goal: 0 (05/23/21 0150)  Complications: No notable events documented.

## 2021-05-23 NOTE — Anesthesia Postprocedure Evaluation (Signed)
Anesthesia Post Note  Patient: Cassandra Peters  Procedure(s) Performed: INTRAOPERATIVE ENDOSCOPY (Esophagus)     Patient location during evaluation: PACU Anesthesia Type: General Level of consciousness: awake and alert and oriented Pain management: pain level controlled Vital Signs Assessment: post-procedure vital signs reviewed and stable Respiratory status: spontaneous breathing, nonlabored ventilation, respiratory function stable and patient connected to nasal cannula oxygen Cardiovascular status: blood pressure returned to baseline and stable Postop Assessment: no apparent nausea or vomiting Anesthetic complications: no   No notable events documented.  Last Vitals:  Vitals:   05/23/21 1539 05/23/21 1540  BP:    Pulse:    Resp: (!) 22 (!) 24  Temp:    SpO2:      Last Pain:  Vitals:   05/23/21 1408  TempSrc:   PainSc: 0-No pain                 Jowana Thumma A.

## 2021-05-23 NOTE — Anesthesia Postprocedure Evaluation (Signed)
Anesthesia Post Note  Patient: Cassandra Peters  Procedure(s) Performed: LAPAROSCOPIC REPAIR OF BILE LEAK AND ABDOMINAL WASHOUT (Abdomen) INTRAOPERATIVE CHOLANGIOGRAM (Abdomen)     Patient location during evaluation: PACU Anesthesia Type: General Level of consciousness: awake Pain management: pain level controlled Respiratory status: spontaneous breathing Cardiovascular status: stable Postop Assessment: no apparent nausea or vomiting Anesthetic complications: no   No notable events documented.  Last Vitals:  Vitals:   05/23/21 0132 05/23/21 0329  BP: 121/72   Pulse: 81   Resp: (!) 23   Temp: 36.9 C 36.7 C  SpO2: 99%     Last Pain:  Vitals:   05/23/21 0329  TempSrc: Oral  PainSc:                  Carrell Rahmani

## 2021-05-23 NOTE — Anesthesia Preprocedure Evaluation (Addendum)
Anesthesia Evaluation  Patient identified by MRN, date of birth, ID band Patient awake    Reviewed: Allergy & Precautions, NPO status , Patient's Chart, lab work & pertinent test results  History of Anesthesia Complications Negative for: history of anesthetic complications  Airway Mallampati: II  TM Distance: >3 FB Neck ROM: Full    Dental  (+) Dental Advisory Given, Chipped,    Pulmonary neg shortness of breath, neg sleep apnea, neg COPD, neg recent URI,   tachypnea  breath sounds clear to auscultation       Cardiovascular negative cardio ROS   Rhythm:Regular Rate:Normal     Neuro/Psych negative neurological ROS  negative psych ROS   GI/Hepatic GERD  Medicated,Bile leak S/P Cholecystectomy Displaced biliary stent S/P ERCP   Endo/Other  negative endocrine ROSdiabetes  Renal/GU negative Renal ROSLab Results      Component                Value               Date                      CREATININE               0.45                05/21/2021           Lab Results      Component                Value               Date                      K                        3.9                 05/21/2021             negative genitourinary   Musculoskeletal negative musculoskeletal ROS (+)   Abdominal   Peds  Hematology negative hematology ROS (+)     Anesthesia Other Findings   Reproductive/Obstetrics Lab Results      Component                Value               Date                      PREGTESTUR               NEGATIVE            05/15/2021                HCG                      <5.0                05/20/2021                                       Anesthesia Physical  Anesthesia Plan  ASA: 3  Anesthesia Plan: General   Post-op Pain Management:    Induction: Intravenous, Rapid sequence and Cricoid pressure planned  PONV Risk Score  and Plan: 4 or greater and Ondansetron, Dexamethasone,  Treatment may vary due to age or medical condition and Midazolam  Airway Management Planned: Oral ETT  Additional Equipment: None  Intra-op Plan:   Post-operative Plan: Extubation in OR  Informed Consent: I have reviewed the patients History and Physical, chart, labs and discussed the procedure including the risks, benefits and alternatives for the proposed anesthesia with the patient or authorized representative who has indicated his/her understanding and acceptance.     Dental advisory given  Plan Discussed with: Anesthesiologist and CRNA  Anesthesia Plan Comments:        Anesthesia Quick Evaluation

## 2021-05-23 NOTE — Progress Notes (Signed)
Pacu RN Report to floor given  Gave report to 6N RN. [phone]. Discussed surgery, meds given in OR and Pacu, VS, IV fluids given, EBL, urine output, pain and other pertinent information. Also discussed if pt had any family or friends here or belongings with them. Discussed low grade temp of 99.3, NGT output of 100 green bile, urine, 2 JP drains. .   Pt exits my care.

## 2021-05-23 NOTE — Progress Notes (Signed)
Day of Surgery   Chief Complaint/Subjective: She underwent surgery with drain placement and IOC showing small injury to CBD which was repaired with 4-0 PDS. She has some discomfort in the abdomen today. She denies nausea  Review of Systems See above, otherwise other systems negative   PMH -  has a past medical history of COVID-19 (03/2021), Dyspnea, GERD (gastroesophageal reflux disease), Gestational diabetes, and Pre-diabetes. PSH -  has a past surgical history that includes No past surgeries; Cholecystectomy (N/A, 05/15/2021); Cholecystectomy (N/A, 05/22/2021); and Intraoperative cholangiogram (N/A, 05/22/2021).  [facility] - family history includes Diabetes in her mother; Hypertension in her mother.   Objective: Vital signs in last 24 hours: Temp:  [97.9 F (36.6 C)-98.4 F (36.9 C)] 98.4 F (36.9 C) (09/14 1346) Pulse Rate:  [68-94] 93 (09/14 1346) Resp:  [12-27] 18 (09/14 1346) BP: (111-132)/(21-89) 132/77 (09/14 1346) SpO2:  [96 %-100 %] 99 % (09/14 1346) Weight:  [65.8 kg] 65.8 kg (09/14 1346) Last BM Date: 05/19/21 Intake/Output from previous day: 09/13 0701 - 09/14 0700 In: 6775.7 [P.O.:520; I.V.:4947; IV Piggyback:1308.7] Out: 1050 [Urine:920; Drains:110; Blood:20] Intake/Output this shift: Total I/O In: 912.6 [I.V.:864.3; IV Piggyback:48.3] Out: 900 [Urine:900]  PE: Gen: NAD Resp: nonlabored Card: tachycardic Abd: soft, ATTP, drains with serous output  Lab Results:  Recent Labs    05/21/21 0410 05/23/21 0247  WBC 11.4* 9.4  HGB 12.5 11.1*  HCT 37.2 31.7*  PLT 178 173   BMET Recent Labs    05/21/21 0410 05/23/21 0247  NA 139 139  K 3.9 3.8  CL 110 108  CO2 21* 23  GLUCOSE 125* 163*  BUN 6 8  CREATININE 0.45 0.37*  CALCIUM 8.5* 8.1*   PT/INR No results for input(s): LABPROT, INR in the last 72 hours. CMP     Component Value Date/Time   NA 139 05/23/2021 0247   NA 145 (H) 08/13/2016 1049   K 3.8 05/23/2021 0247   CL 108 05/23/2021 0247   CO2 23  05/23/2021 0247   GLUCOSE 163 (H) 05/23/2021 0247   BUN 8 05/23/2021 0247   BUN 12 08/13/2016 1049   CREATININE 0.37 (L) 05/23/2021 0247   CREATININE 0.59 05/14/2016 1227   CALCIUM 8.1 (L) 05/23/2021 0247   PROT 5.4 (L) 05/23/2021 0247   PROT 6.8 08/13/2016 1049   ALBUMIN 3.0 (L) 05/23/2021 0247   ALBUMIN 4.5 08/13/2016 1049   AST 45 (H) 05/23/2021 0247   ALT 34 05/23/2021 0247   ALKPHOS 53 05/23/2021 0247   BILITOT 1.4 (H) 05/23/2021 0247   BILITOT <0.2 08/13/2016 1049   GFRNONAA >60 05/23/2021 0247   GFRNONAA >89 05/14/2016 1227   GFRAA 141 08/13/2016 1049   GFRAA >89 05/14/2016 1227   Lipase     Component Value Date/Time   LIPASE 1,079 (H) 05/23/2021 0247    Studies/Results: CT ABDOMEN WO CONTRAST  Result Date: 05/22/2021 CLINICAL DATA:  Abdominal pain in a patient. Biliary obstruction suspected. Assess common bile duct stent posterior CP. Cholecystectomy on 05/15/2021 complicated by bile leak. EXAM: CT ABDOMEN WITHOUT CONTRAST TECHNIQUE: Multidetector CT imaging of the abdomen was performed following the standard protocol without IV contrast. COMPARISON:  CT abdomen pelvis 05/20/2021. FINDINGS: Lower chest: Pneumomediastinum noted. Hepatobiliary: No focal liver abnormality. No gallstones, gallbladder wall thickening, or pericholecystic fluid. Limited evaluation for biliary dilatation on this noncontrast study. The common bile duct stent proximal tip is noted along the inferior vena cava just inferior to the hemidiaphragm. The distal tip of the common bile  duct stent is noted within the lumen of the second portion of the duodenum. No pneumobilia is noted. Pancreas: No focal lesion. Normal pancreatic contour. No surrounding inflammatory changes. No main pancreatic ductal dilatation. Spleen: Normal in size without focal abnormality. Adrenals/Urinary Tract: The right adrenal gland is not well visualized. The left adrenal gland demonstrates no nodularity. No nephrolithiasis and no  hydronephrosis. No definite contour-deforming renal mass. No ureterolithiasis or hydroureter of the visualized ureters Stomach/Bowel: PO contrast is noted within a short loop of small bowel within the left mid abdomen. Stomach is dilated with gas and fluid and is within normal limits. No evidence of bowel wall thickening or dilatation. Vascular/Lymphatic: No abdominal aortaaneurysm. No abdominal lymphadenopathy. Other: Small volume free fluid ascites. Small to moderate volume scattered foci of gas throughout the retroperitoneum. No definite pneumoperitoneum. Hyperdensity within the retroperitoneum likely related to extravasated contrast during ERCP (2:53). No organized fluid collection. Musculoskeletal: No abdominal wall hernia or abnormality. No suspicious lytic or blastic osseous lesions. No acute displaced fracture. IMPRESSION: 1. Moderate volume of free gas within the retroperitoneum as well as pneumomediastinum with malpositioned common bile duct stent. The common bile duct stent is noted within the second portion of the duodenum lumen from where it courses through the duodenal wall with the proximal tip is noted along the inferior vena cava just inferior to the hemidiaphragm. Hyperdensity within the mid abdomen retroperitoneum likely related to extravasated contrast during ERCP. Marked limited evaluation on this noncontrast study. Given associated duodenal perforation through the course of the common bile duct and injury to the inferior vena cava not excluded, recommend emergent surgical consultation. 2. Poor evaluation of a known bile leak/gallbladder fossa on this noncontrast study. 3. At least small volume ascites. 4. The right adrenal gland is not well visualized. 5. Stomach is dilated with gas and fluid. Consider enteric tube placement. These results were called by telephone at the time of interpretation on 05/22/2021 at 5:30 pm to provider Kerin Salen , who verbally acknowledged these results.  Electronically Signed   By: Tish Frederickson M.D.   On: 05/22/2021 17:42   DG ERCP  Result Date: 05/22/2021 CLINICAL DATA:  Bile leak following cholecystectomy EXAM: ERCP TECHNIQUE: Multiple spot images obtained with the fluoroscopic device and submitted for interpretation post-procedure. FLUOROSCOPY TIME:  Fluoroscopy Time:  16 minutes 46 seconds COMPARISON:  Nuclear medicine HIDA scan 05/21/2021 FINDINGS: A total of 11 intraoperative spot images are submitted for review. The images demonstrate a flexible duodenal scope in the descending duodenum with initial wire cannulation of the main pancreatic duct followed by cannulation of the common bile duct. On subsequent images, contrast material is visualized extravasated into the peritoneal space. On the final image, a plastic biliary stent has been placed. IMPRESSION: 1. Bile leak. 2. Placement of plastic biliary stent. These images were submitted for radiologic interpretation only. Please see the procedural report for the amount of contrast and the fluoroscopy time utilized. Electronically Signed   By: Malachy Moan M.D.   On: 05/22/2021 15:36   DG C-Arm 1-60 Min-No Report  Result Date: 05/22/2021 Fluoroscopy was utilized by the requesting physician.  No radiographic interpretation.   NM HEPATOBILIARY LEAK (POST-SURGICAL)  Result Date: 05/21/2021 CLINICAL DATA:  Abdominal pain, prior cholecystectomy. EXAM: NUCLEAR MEDICINE HEPATOBILIARY IMAGING TECHNIQUE: Sequential images of the abdomen were obtained out to 60 minutes following intravenous administration of radiopharmaceutical. RADIOPHARMACEUTICALS:  5.3 mCi Tc-1m  Choletec IV COMPARISON:  CT scan 05/20/2021 FINDINGS: There is abnormal extension of radiopharmaceutical along  the right paracolic gutter, indicating bile leak. There is only equivocal extension of the radiopharmaceutical into bowel. IMPRESSION: 1. Positive for bile leak, with radiopharmaceutical preferentially tracking down the right  paracolic gutter. Electronically Signed   By: Gaylyn Rong M.D.   On: 05/21/2021 13:22    Anti-infectives: Anti-infectives (From admission, onward)    Start     Dose/Rate Route Frequency Ordered Stop   05/22/21 1500  [MAR Hold]  piperacillin-tazobactam (ZOSYN) IVPB 3.375 g        (MAR Hold since Wed 05/23/2021 at 1341.Hold Reason: Transfer to a Procedural area)   3.375 g 12.5 mL/hr over 240 Minutes Intravenous Every 8 hours 05/22/21 1410     05/20/21 0300  piperacillin-tazobactam (ZOSYN) IVPB 3.375 g        3.375 g 100 mL/hr over 30 Minutes Intravenous  Once 05/20/21 0248 05/20/21 0326       Assessment/Plan  s/p Procedure(s): INTRAOPERATIVE ENDOSCOPY 05/23/2021  Discussed case with multiple surgeons and Dr. Marca Ancona. The injury seems well drained. The stent is not in the right location and should be removed. Due to location, this will be done in the operating room at the trauma center. Labs today show post ERCP pancreatitis -transfer to [facility] -plan for endoscopy in OR with Dr. Marca Ancona today -continue abx -place NG tube -pain control -hydration -bowel rest for 4-5 days with potential UGI to evaluate the sphincterotomy at the time.   LOS: 2 days   [facility] 05/23/2021, 2:12 PM Please see Amion for pager number during day hours 7:00am-4:30pm or 7:00am -11:30am on weekends

## 2021-05-23 NOTE — Progress Notes (Signed)
[facility] arrived to transport pt to [facility]. Husband called about arrival of [facility] & now in the room. Husband has pt's belongings. Pt stable at time of transfer to [facility].

## 2021-05-24 ENCOUNTER — Encounter (HOSPITAL_COMMUNITY): Payer: Self-pay | Admitting: General Surgery

## 2021-05-24 LAB — COMPREHENSIVE METABOLIC PANEL
ALT: 22 U/L (ref 0–44)
AST: 21 U/L (ref 15–41)
Albumin: 2.3 g/dL — ABNORMAL LOW (ref 3.5–5.0)
Alkaline Phosphatase: 49 U/L (ref 38–126)
Anion gap: 5 (ref 5–15)
BUN: 8 mg/dL (ref 6–20)
CO2: 23 mmol/L (ref 22–32)
Calcium: 7.8 mg/dL — ABNORMAL LOW (ref 8.9–10.3)
Chloride: 114 mmol/L — ABNORMAL HIGH (ref 98–111)
Creatinine, Ser: 0.56 mg/dL (ref 0.44–1.00)
GFR, Estimated: >60 mL/min (ref >60)
Glucose, Bld: 121 mg/dL — ABNORMAL HIGH (ref 70–99)
Potassium: 3.3 mmol/L — ABNORMAL LOW (ref 3.5–5.1)
Sodium: 142 mmol/L (ref 135–145)
Total Bilirubin: 1.2 mg/dL (ref 0.3–1.2)
Total Protein: 4.8 g/dL — ABNORMAL LOW (ref 6.5–8.1)

## 2021-05-24 LAB — CBC
HCT: 30.6 % — ABNORMAL LOW (ref 36.0–46.0)
Hemoglobin: 10.4 g/dL — ABNORMAL LOW (ref 12.0–15.0)
MCH: 30.8 pg (ref 26.0–34.0)
MCHC: 34 g/dL (ref 30.0–36.0)
MCV: 90.5 fL (ref 80.0–100.0)
Platelets: 201 10*3/uL (ref 150–400)
RBC: 3.38 MIL/uL — ABNORMAL LOW (ref 3.87–5.11)
RDW: 13.2 % (ref 11.5–15.5)
WBC: 7.4 10*3/uL (ref 4.0–10.5)
nRBC: 0 % (ref 0.0–0.2)

## 2021-05-24 LAB — LIPASE, BLOOD: Lipase: 134 U/L — ABNORMAL HIGH (ref 11–51)

## 2021-05-24 MED ORDER — POTASSIUM CHLORIDE 10 MEQ/100ML IV SOLN
10.0000 meq | INTRAVENOUS | Status: AC
Start: 2021-05-24 — End: 2021-05-24
  Administered 2021-05-24 (×3): 10 meq via INTRAVENOUS
  Filled 2021-05-24 (×3): qty 100

## 2021-05-24 MED ORDER — MENTHOL 3 MG MT LOZG: 1.0000 | LOZENGE | OROMUCOSAL | Status: DC | PRN

## 2021-05-24 NOTE — Progress Notes (Signed)
Physical Therapy Evaluation Patient Details Name: Cassandra Peters MRN: [id number] DOB: [birth year] Today's Date: 05/24/2021  History of Present Illness  Pt is a 40yo female presenting to [facility] ED on 9/11 with severe abdominal pain, nausea, and vomiting. She is recently s/p laparoscopic cholecystectomy on 9/6. Endoscopic retrograde cholangiopancreatography was completed 9/13 but the stent was misaligned so pt underwent a laparoscopic repair of bile leak and abdominal washout that same day. PMH: COVID 03/2021.  Clinical Impression  Pt presents with the impairments above and problems listed below. Educated on logrolling technique for bed mobility as well as splinting abdomen for pain reduction. Pt required min assist for bed mobility, supervision to min guard for transfers and short distance ambulation in room. Educated pt about ankle pumps and pt demonstrated good technique. Feel pt will progress well and will not require skilled therapy upon discharge. We will continue to follow her acutely to promote independence with functional mobility.      Recommendations for follow up therapy are one component of a multi-disciplinary discharge planning process, led by the attending physician.  Recommendations may be updated based on patient status, additional functional criteria and insurance authorization.  Follow Up Recommendations No PT follow up    Equipment Recommendations  None recommended by PT    Recommendations for Other Services       Precautions / Restrictions Precautions Precautions: Other (comment) Precaution Comments: NG tube, 2 JP drains Restrictions Weight Bearing Restrictions: No      Mobility  Bed Mobility Overal bed mobility: Needs Assistance Bed Mobility: Rolling;Sidelying to Sit Rolling: Min guard Sidelying to sit: Min assist       General bed mobility comments: Pt educated on logrolling technique for pain management; able to demonstrate good form without cuing. Pt required min  assist for trunk control to acheive fully upright sitting from sidelying.    Transfers Overall transfer level: Needs assistance Equipment used: None Transfers: Sit to/from Stand Sit to Stand: Supervision;Min guard         General transfer comment: Pt supervision to min guard for transfer for safety and line management only. Educated pt about splinting with pillow for transfers to help with pain management.  Ambulation/Gait Ambulation/Gait assistance: Min guard Gait Distance (Feet): 20 Feet Assistive device: None Gait Pattern/deviations: Step-through pattern;Decreased step length - right;Decreased step length - left Gait velocity: decreased   General Gait Details: Pt ambulated short distance in room with guarded gait consistent with s/p abdominal surgery. Tenative, short steps, but no overt LOB noted. Min guard for safety and line management.  Stairs            Wheelchair Mobility    Modified Rankin (Stroke Patients Only)       Balance Overall balance assessment: Needs assistance Sitting-balance support: No upper extremity supported;Feet supported Sitting balance-Leahy Scale: Good     Standing balance support: No upper extremity supported;During functional activity Standing balance-Leahy Scale: Fair                               Pertinent Vitals/Pain Pain Assessment: 0-10 Pain Score: 6  Pain Location: throat from NG tube Pain Descriptors / Indicators: Discomfort Pain Intervention(s): Monitored during session    Home Living Family/patient expects to be discharged to:: Private residence Living Arrangements: Spouse/significant other;Children Available Help at Discharge: Family;Available 24 hours/day Type of Home: House Home Access: Stairs to enter Entrance Stairs-Rails: Right Entrance Stairs-Number of Steps: 4 Home Layout: Two level;Able  to live on main level with bedroom/bathroom Home Equipment: None      Prior Function Level of  Independence: Independent               Hand Dominance   Dominant Hand: Right    Extremity/Trunk Assessment   Upper Extremity Assessment Upper Extremity Assessment: Overall WFL for tasks assessed    Lower Extremity Assessment Lower Extremity Assessment: Overall WFL for tasks assessed    Cervical / Trunk Assessment Cervical / Trunk Assessment: Other exceptions Cervical / Trunk Exceptions: s/p multiple abdominal surgeries.  Communication   Communication: Interpreter utilized Adult nurse)  Cognition Arousal/Alertness: Awake/alert Behavior During Therapy: WFL for tasks assessed/performed Overall Cognitive Status: Within Functional Limits for tasks assessed                                        General Comments      Exercises Total Joint Exercises Ankle Circles/Pumps: AROM;Both;20 reps;Seated   Assessment/Plan    PT Assessment Patient needs continued PT services  PT Problem List Decreased strength;Decreased range of motion;Decreased activity tolerance;Decreased mobility       PT Treatment Interventions DME instruction;Gait training;Stair training;Functional mobility training;Therapeutic activities;Therapeutic exercise;Patient/family education    PT Goals (Current goals can be found in the Care Plan section)  Acute Rehab PT Goals Patient Stated Goal: to reduce pain PT Goal Formulation: With patient Time For Goal Achievement: 06/07/21 Potential to Achieve Goals: Good    Frequency Min 3X/week   Barriers to discharge        Co-evaluation               AM-PAC PT "6 Clicks" Mobility  Outcome Measure Help needed turning from your back to your side while in a flat bed without using bedrails?: A Little Help needed moving from lying on your back to sitting on the side of a flat bed without using bedrails?: A Little Help needed moving to and from a bed to a chair (including a wheelchair)?: A Little Help needed standing up from a chair  using your arms (e.g., wheelchair or bedside chair)?: A Little Help needed to walk in hospital room?: A Little Help needed climbing 3-5 steps with a railing? : A Little 6 Click Score: 18    End of Session   Activity Tolerance: Patient tolerated treatment well Patient left: with call bell/phone within reach;in chair Nurse Communication: Mobility status PT Visit Diagnosis: Difficulty in walking, not elsewhere classified (R26.2)    Time: 2505-3976 PT Time Calculation (min) (ACUTE ONLY): 31 min   Charges:   PT Evaluation $PT Eval Low Complexity: 1 Low PT Treatments $Therapeutic Activity: 8-22 mins        Johnn Hai, SPT  Johnn Hai 05/24/2021, 1:40 PM

## 2021-05-24 NOTE — Progress Notes (Signed)
1 Day Post-Op   Chief Complaint/Subjective: NG is uncomfortable, pain controlled  Review of Systems See above, otherwise other systems negative   PMH -  has a past medical history of COVID-19 (03/2021), Dyspnea, GERD (gastroesophageal reflux disease), Gestational diabetes, and Pre-diabetes. PSH - @ has a past surgical history that includes No past surgeries; Cholecystectomy (N/A, 05/15/2021); Cholecystectomy (N/A, 05/22/2021); Intraoperative cholangiogram (N/A, 05/22/2021); laparotomy (N/A, 05/23/2021); Endoscopic retrograde cholangiopancreatography (ercp) with propofol (N/A, 05/22/2021); pancreatic stent placement (05/22/2021); sphincterotomy (05/22/2021); and biliary stent placement (N/A, 05/22/2021).  [facility] - family history includes Diabetes in her mother; Hypertension in her mother.   Objective: Vital signs in last 24 hours: Temp:  [97.7 F (36.5 C)-99.3 F (37.4 C)] 98.4 F (36.9 C) (09/15 1010) Pulse Rate:  [60-117] 60 (09/15 1010) Resp:  [11-28] 18 (09/15 1010) BP: (116-132)/(70-81) 116/76 (09/15 1010) SpO2:  [92 %-100 %] 100 % (09/15 1010) Weight:  [65.8 kg] 65.8 kg (09/14 1346) Last BM Date: 05/19/21 Intake/Output from previous day: 09/14 0701 - 09/15 0700 In: 2507.5 [I.V.:2397.3; IV Piggyback:110.2] Out: 2681 [Urine:2150; Emesis/NG output:350; Drains:180; Blood:1] Intake/Output this shift: Total I/O In: -  Out: 910 [Urine:850; Drains:60]  PE: Gen: NAD Resp: nonlabored Card: RRR Abd: soft, drains serosanguinous with some bile tinged color  Lab Results:  Recent Labs    05/23/21 0247 05/24/21 0232  WBC 9.4 7.4  HGB 11.1* 10.4*  HCT 31.7* 30.6*  PLT 173 201   BMET Recent Labs    05/23/21 0247 05/24/21 0232  NA 139 142  K 3.8 3.3*  CL 108 114*  CO2 23 23  GLUCOSE 163* 121*  BUN 8 8  CREATININE 0.37* 0.56  CALCIUM 8.1* 7.8*   PT/INR No results for input(s): LABPROT, INR in the last 72 hours. CMP     Component Value Date/Time   NA 142 05/24/2021 0232    NA 145 (H) 08/13/2016 1049   K 3.3 (L) 05/24/2021 0232   CL 114 (H) 05/24/2021 0232   CO2 23 05/24/2021 0232   GLUCOSE 121 (H) 05/24/2021 0232   BUN 8 05/24/2021 0232   BUN 12 08/13/2016 1049   CREATININE 0.56 05/24/2021 0232   CREATININE 0.59 05/14/2016 1227   CALCIUM 7.8 (L) 05/24/2021 0232   PROT 4.8 (L) 05/24/2021 0232   PROT 6.8 08/13/2016 1049   ALBUMIN 2.3 (L) 05/24/2021 0232   ALBUMIN 4.5 08/13/2016 1049   AST 21 05/24/2021 0232   ALT 22 05/24/2021 0232   ALKPHOS 49 05/24/2021 0232   BILITOT 1.2 05/24/2021 0232   BILITOT <0.2 08/13/2016 1049   GFRNONAA >60 05/24/2021 0232   GFRNONAA >89 05/14/2016 1227   GFRAA 141 08/13/2016 1049   GFRAA >89 05/14/2016 1227   Lipase     Component Value Date/Time   LIPASE 134 (H) 05/24/2021 0232    Studies/Results: CT ABDOMEN WO CONTRAST  Result Date: 05/22/2021 CLINICAL DATA:  Abdominal pain in a patient. Biliary obstruction suspected. Assess common bile duct stent posterior CP. Cholecystectomy on 05/15/2021 complicated by bile leak. EXAM: CT ABDOMEN WITHOUT CONTRAST TECHNIQUE: Multidetector CT imaging of the abdomen was performed following the standard protocol without IV contrast. COMPARISON:  CT abdomen pelvis 05/20/2021. FINDINGS: Lower chest: Pneumomediastinum noted. Hepatobiliary: No focal liver abnormality. No gallstones, gallbladder wall thickening, or pericholecystic fluid. Limited evaluation for biliary dilatation on this noncontrast study. The common bile duct stent proximal tip is noted along the inferior vena cava just inferior to the hemidiaphragm. The distal tip of the common bile duct stent  is noted within the lumen of the second portion of the duodenum. No pneumobilia is noted. Pancreas: No focal lesion. Normal pancreatic contour. No surrounding inflammatory changes. No main pancreatic ductal dilatation. Spleen: Normal in size without focal abnormality. Adrenals/Urinary Tract: The right adrenal gland is not well visualized.  The left adrenal gland demonstrates no nodularity. No nephrolithiasis and no hydronephrosis. No definite contour-deforming renal mass. No ureterolithiasis or hydroureter of the visualized ureters Stomach/Bowel: PO contrast is noted within a short loop of small bowel within the left mid abdomen. Stomach is dilated with gas and fluid and is within normal limits. No evidence of bowel wall thickening or dilatation. Vascular/Lymphatic: No abdominal aortaaneurysm. No abdominal lymphadenopathy. Other: Small volume free fluid ascites. Small to moderate volume scattered foci of gas throughout the retroperitoneum. No definite pneumoperitoneum. Hyperdensity within the retroperitoneum likely related to extravasated contrast during ERCP (2:53). No organized fluid collection. Musculoskeletal: No abdominal wall hernia or abnormality. No suspicious lytic or blastic osseous lesions. No acute displaced fracture. IMPRESSION: 1. Moderate volume of free gas within the retroperitoneum as well as pneumomediastinum with malpositioned common bile duct stent. The common bile duct stent is noted within the second portion of the duodenum lumen from where it courses through the duodenal wall with the proximal tip is noted along the inferior vena cava just inferior to the hemidiaphragm. Hyperdensity within the mid abdomen retroperitoneum likely related to extravasated contrast during ERCP. Marked limited evaluation on this noncontrast study. Given associated duodenal perforation through the course of the common bile duct and injury to the inferior vena cava not excluded, recommend emergent surgical consultation. 2. Poor evaluation of a known bile leak/gallbladder fossa on this noncontrast study. 3. At least small volume ascites. 4. The right adrenal gland is not well visualized. 5. Stomach is dilated with gas and fluid. Consider enteric tube placement. These results were called by telephone at the time of interpretation on 05/22/2021 at 5:30 pm  to provider Kerin Salen , who verbally acknowledged these results. Electronically Signed   By: Tish Frederickson M.D.   On: 05/22/2021 17:42   DG Abd 1 View  Result Date: 05/23/2021 CLINICAL DATA:  NG tube insertion. EXAM: ABDOMEN - 1 VIEW COMPARISON:  CT abdomen and pelvis 05/22/2021. FINDINGS: Nasogastric tube tip is at the level of the gastric antrum. There is gaseous distention of large and small bowel without definite bowel obstruction. Catheter overlies the right abdomen. No fractures are seen. IMPRESSION: Nasogastric tube tip at the level of the gastric antrum. Electronically Signed   By: Darliss Cheney M.D.   On: 05/23/2021 19:52   DG C-Arm 1-60 Min-No Report  Result Date: 05/22/2021 Fluoroscopy was utilized by the requesting physician.  No radiographic interpretation.    Anti-infectives: Anti-infectives (From admission, onward)    Start     Dose/Rate Route Frequency Ordered Stop   05/22/21 1500  piperacillin-tazobactam (ZOSYN) IVPB 3.375 g        3.375 g 12.5 mL/hr over 240 Minutes Intravenous Every 8 hours 05/22/21 1410     05/20/21 0300  piperacillin-tazobactam (ZOSYN) IVPB 3.375 g        3.375 g 100 mL/hr over 30 Minutes Intravenous  Once 05/20/21 0248 05/20/21 0326       Assessment/Plan  40 yo female s/p lap chole with post op leak, lap drain placement and CBD repair, ERCP and then EGD for stent removal. Drains with some concern for bilious output -continue NG tube -cepacol for sore throat -ambulate/PT -replace K -remove  foley   LOS: 3 days   [facility] Surgery 05/24/2021, 1:40 PM Please see Amion for pager number during day hours 7:00am-4:30pm or 7:00am -11:30am on weekends

## 2021-05-24 NOTE — Progress Notes (Signed)
Subjective: Patient complains of discomfort in her throat with the NG tube. She is also worried about swelling in her hands and legs. She denies nausea, denies abdominal pain and reports passing gas.  Objective: Vital signs in last 24 hours: Temp:  [97.7 F (36.5 C)-99.3 F (37.4 C)] 98.2 F (36.8 C) (09/15 0607) Pulse Rate:  [66-117] 70 (09/15 0607) Resp:  [11-28] 16 (09/15 0607) BP: (111-132)/(21-81) 124/81 (09/15 0607) SpO2:  [92 %-100 %] 98 % (09/15 0607) Weight:  [65.8 kg] 65.8 kg (09/14 1346) Weight change: 0 kg Last BM Date: 05/19/21  PE: Ill-appearing, mild pallor, no icterus GENERAL: NG tube to wall suction with 150 cc of bilious fluid in the canister ABDOMEN: Soft, drains with small amount of bilious and small amount of serosanguinous fluid, nontender, sluggish bowel sounds EXTREMITIES: Swollen feet and both hands(likely from IV fluids)  Lab Results: Results for orders placed or performed during the hospital encounter of 05/20/21 (from the past 48 hour(s))  Type and screen [facility]     Status: None (Preliminary result)   Collection Time: 05/22/21  6:52 PM  Result Value Ref Range   ABO/RH(D) O POS    Antibody Screen NEG    Sample Expiration 05/25/2021,2359    Unit Number [id number]    Blood Component Type RBC LR PHER1    Unit division 00    Status of Unit ALLOCATED    Transfusion Status OK TO TRANSFUSE    Crossmatch Result Compatible    Unit Number [id number]    Blood Component Type RED CELLS,LR    Unit division 00    Status of Unit ALLOCATED    Transfusion Status OK TO TRANSFUSE    Crossmatch Result      Compatible Performed at [facility], [address] [place], [place] [address]   Prepare RBC (crossmatch)     Status: None   Collection Time: 05/22/21  8:00 PM  Result Value Ref Range   Order Confirmation      ORDER PROCESSED BY BLOOD BANK Performed at [facility], [address],  [place], [place] [address]   MRSA Next Gen by PCR, Nasal     Status: None   Collection Time: 05/23/21  1:32 AM   Specimen: Nasal Mucosa; Nasal Swab  Result Value Ref Range   MRSA by PCR Next Gen NOT DETECTED NOT DETECTED    Comment: (NOTE) The GeneXpert MRSA Assay (FDA approved for NASAL specimens only), is one component of a comprehensive MRSA colonization surveillance program. It is not intended to diagnose MRSA infection nor to guide or monitor treatment for MRSA infections. Test performance is not FDA approved in patients less than 22 years old. Performed at [facility], [address] [place], [place] [address]   Basic metabolic panel     Status: Abnormal   Collection Time: 05/23/21  2:47 AM  Result Value Ref Range   Sodium 139 135 - 145 mmol/L   Potassium 3.8 3.5 - 5.1 mmol/L   Chloride 108 98 - 111 mmol/L   CO2 23 22 - 32 mmol/L   Glucose, Bld 163 (H) 70 - 99 mg/dL    Comment: Glucose reference range applies only to samples taken after fasting for at least 8 hours.   BUN 8 6 - 20 mg/dL   Creatinine, Ser 6.28 (L) 0.44 - 1.00 mg/dL   Calcium 8.1 (L) 8.9 - 10.3 mg/dL   GFR, Estimated >36 >62 mL/min  Comment: (NOTE) Calculated using the CKD-EPI Creatinine Equation (2021)    Anion gap 8 5 - 15    Comment: Performed at [facility], [address], [place], [place] [address]  Hepatic function panel     Status: Abnormal   Collection Time: 05/23/21  2:47 AM  Result Value Ref Range   Total Protein 5.4 (L) 6.5 - 8.1 g/dL   Albumin 3.0 (L) 3.5 - 5.0 g/dL   AST 45 (H) 15 - 41 U/L   ALT 34 0 - 44 U/L   Alkaline Phosphatase 53 38 - 126 U/L   Total Bilirubin 1.4 (H) 0.3 - 1.2 mg/dL   Bilirubin, Direct 0.5 (H) 0.0 - 0.2 mg/dL   Indirect Bilirubin 0.9 0.3 - 0.9 mg/dL    Comment: Performed at [facility], [address] [place], [place] [address]  Lipase, blood     Status: Abnormal   Collection Time: 05/23/21  2:47 AM   Result Value Ref Range   Lipase 1,079 (H) 11 - 51 U/L    Comment: RESULTS CONFIRMED BY MANUAL DILUTION Performed at [facility], [address], [place], [place] [address]   CBC     Status: Abnormal   Collection Time: 05/23/21  2:47 AM  Result Value Ref Range   WBC 9.4 4.0 - 10.5 K/uL   RBC 3.59 (L) 3.87 - 5.11 MIL/uL   Hemoglobin 11.1 (L) 12.0 - 15.0 g/dL   HCT 29.5 (L) 28.4 - 13.2 %   MCV 88.3 80.0 - 100.0 fL   MCH 30.9 26.0 - 34.0 pg   MCHC 35.0 30.0 - 36.0 g/dL   RDW 44.0 10.2 - 72.5 %   Platelets 173 150 - 400 K/uL   nRBC 0.0 0.0 - 0.2 %    Comment: Performed at [facility], [address] [place], [place] [address]  Type and screen [facility]     Status: None (Preliminary result)   Collection Time: 05/23/21  2:05 PM  Result Value Ref Range   ABO/RH(D) O POS    Antibody Screen NEG    Sample Expiration 05/26/2021,2359    Unit Number [id number]    Blood Component Type RBC LR PHER2    Unit division 00    Status of Unit ALLOCATED    Transfusion Status OK TO TRANSFUSE    Crossmatch Result      Compatible Performed at [facility], [address] [place], [place] [address]    Unit Number [id number]    Blood Component Type RED CELLS,LR    Unit division 00    Status of Unit ALLOCATED    Transfusion Status OK TO TRANSFUSE    Crossmatch Result Compatible   Prepare RBC (crossmatch)     Status: None   Collection Time: 05/23/21  2:07 PM  Result Value Ref Range   Order Confirmation      ORDER PROCESSED BY BLOOD BANK Performed at [facility], [address], [place], [place] [address]   Lipase, blood     Status: Abnormal   Collection Time: 05/24/21  2:32 AM  Result Value Ref Range   Lipase 134 (H) 11 - 51 U/L    Comment: Performed at [facility], [address] [place], [place] [address]  CBC     Status: Abnormal   Collection Time: 05/24/21  2:32 AM  Result Value Ref Range   WBC 7.4  4.0 -  10.5 K/uL   RBC 3.38 (L) 3.87 - 5.11 MIL/uL   Hemoglobin 10.4 (L) 12.0 - 15.0 g/dL   HCT 05.6 (L) 97.9 - 48.0 %   MCV 90.5 80.0 - 100.0 fL   MCH 30.8 26.0 - 34.0 pg   MCHC 34.0 30.0 - 36.0 g/dL   RDW 16.5 53.7 - 48.2 %   Platelets 201 150 - 400 K/uL   nRBC 0.0 0.0 - 0.2 %    Comment: Performed at [facility], [address], [place], [place] [address]  Comprehensive metabolic panel     Status: Abnormal   Collection Time: 05/24/21  2:32 AM  Result Value Ref Range   Sodium 142 135 - 145 mmol/L   Potassium 3.3 (L) 3.5 - 5.1 mmol/L   Chloride 114 (H) 98 - 111 mmol/L   CO2 23 22 - 32 mmol/L   Glucose, Bld 121 (H) 70 - 99 mg/dL    Comment: Glucose reference range applies only to samples taken after fasting for at least 8 hours.   BUN 8 6 - 20 mg/dL   Creatinine, Ser 7.54 0.44 - 1.00 mg/dL   Calcium 7.8 (L) 8.9 - 10.3 mg/dL   Total Protein 4.8 (L) 6.5 - 8.1 g/dL   Albumin 2.3 (L) 3.5 - 5.0 g/dL   AST 21 15 - 41 U/L   ALT 22 0 - 44 U/L   Alkaline Phosphatase 49 38 - 126 U/L   Total Bilirubin 1.2 0.3 - 1.2 mg/dL   GFR, Estimated >49 >20 mL/min    Comment: (NOTE) Calculated using the CKD-EPI Creatinine Equation (2021)    Anion gap 5 5 - 15    Comment: Performed at [facility], [address], [place], [place] [address]    Studies/Results: CT ABDOMEN WO CONTRAST  Result Date: 05/22/2021 CLINICAL DATA:  Abdominal pain in a patient. Biliary obstruction suspected. Assess common bile duct stent posterior CP. Cholecystectomy on 05/15/2021 complicated by bile leak. EXAM: CT ABDOMEN WITHOUT CONTRAST TECHNIQUE: Multidetector CT imaging of the abdomen was performed following the standard protocol without IV contrast. COMPARISON:  CT abdomen pelvis 05/20/2021. FINDINGS: Lower chest: Pneumomediastinum noted. Hepatobiliary: No focal liver abnormality. No gallstones, gallbladder wall thickening, or pericholecystic fluid. Limited evaluation for biliary dilatation on this  noncontrast study. The common bile duct stent proximal tip is noted along the inferior vena cava just inferior to the hemidiaphragm. The distal tip of the common bile duct stent is noted within the lumen of the second portion of the duodenum. No pneumobilia is noted. Pancreas: No focal lesion. Normal pancreatic contour. No surrounding inflammatory changes. No main pancreatic ductal dilatation. Spleen: Normal in size without focal abnormality. Adrenals/Urinary Tract: The right adrenal gland is not well visualized. The left adrenal gland demonstrates no nodularity. No nephrolithiasis and no hydronephrosis. No definite contour-deforming renal mass. No ureterolithiasis or hydroureter of the visualized ureters Stomach/Bowel: PO contrast is noted within a short loop of small bowel within the left mid abdomen. Stomach is dilated with gas and fluid and is within normal limits. No evidence of bowel wall thickening or dilatation. Vascular/Lymphatic: No abdominal aortaaneurysm. No abdominal lymphadenopathy. Other: Small volume free fluid ascites. Small to moderate volume scattered foci of gas throughout the retroperitoneum. No definite pneumoperitoneum. Hyperdensity within the retroperitoneum likely related to extravasated contrast during ERCP (2:53). No organized fluid collection. Musculoskeletal: No abdominal wall hernia or abnormality. No suspicious lytic or blastic osseous lesions. No acute displaced fracture. IMPRESSION: 1. Moderate volume  of free gas within the retroperitoneum as well as pneumomediastinum with malpositioned common bile duct stent. The common bile duct stent is noted within the second portion of the duodenum lumen from where it courses through the duodenal wall with the proximal tip is noted along the inferior vena cava just inferior to the hemidiaphragm. Hyperdensity within the mid abdomen retroperitoneum likely related to extravasated contrast during ERCP. Marked limited evaluation on this noncontrast  study. Given associated duodenal perforation through the course of the common bile duct and injury to the inferior vena cava not excluded, recommend emergent surgical consultation. 2. Poor evaluation of a known bile leak/gallbladder fossa on this noncontrast study. 3. At least small volume ascites. 4. The right adrenal gland is not well visualized. 5. Stomach is dilated with gas and fluid. Consider enteric tube placement. These results were called by telephone at the time of interpretation on 05/22/2021 at 5:30 pm to provider Kerin Salen , who verbally acknowledged these results. Electronically Signed   By: Tish Frederickson M.D.   On: 05/22/2021 17:42   DG Abd 1 View  Result Date: 05/23/2021 CLINICAL DATA:  NG tube insertion. EXAM: ABDOMEN - 1 VIEW COMPARISON:  CT abdomen and pelvis 05/22/2021. FINDINGS: Nasogastric tube tip is at the level of the gastric antrum. There is gaseous distention of large and small bowel without definite bowel obstruction. Catheter overlies the right abdomen. No fractures are seen. IMPRESSION: Nasogastric tube tip at the level of the gastric antrum. Electronically Signed   By: Darliss Cheney M.D.   On: 05/23/2021 19:52   DG ERCP  Result Date: 05/22/2021 CLINICAL DATA:  Bile leak following cholecystectomy EXAM: ERCP TECHNIQUE: Multiple spot images obtained with the fluoroscopic device and submitted for interpretation post-procedure. FLUOROSCOPY TIME:  Fluoroscopy Time:  16 minutes 46 seconds COMPARISON:  Nuclear medicine HIDA scan 05/21/2021 FINDINGS: A total of 11 intraoperative spot images are submitted for review. The images demonstrate a flexible duodenal scope in the descending duodenum with initial wire cannulation of the main pancreatic duct followed by cannulation of the common bile duct. On subsequent images, contrast material is visualized extravasated into the peritoneal space. On the final image, a plastic biliary stent has been placed. IMPRESSION: 1. Bile leak. 2.  Placement of plastic biliary stent. These images were submitted for radiologic interpretation only. Please see the procedural report for the amount of contrast and the fluoroscopy time utilized. Electronically Signed   By: Malachy Moan M.D.   On: 05/22/2021 15:36   DG C-Arm 1-60 Min-No Report  Result Date: 05/22/2021 Fluoroscopy was utilized by the requesting physician.  No radiographic interpretation.    Medications: I have reviewed the patient's current medications.  Assessment: Status post laparoscopic repair of bile leak and abdominal washout Status post EGD with removal of malpositioned stent in OR yesterday WBC 7.4, hemoglobin 10.4 likely hemodilution Mild hypokalemia, potassium 3.3, normal renal function, no acidosis Lipase decreased to 134 Normal LFTs  Plan: Continue supportive management with IV fluids and IV antibiotics Physical therapy consultation, hopefully we can mobilize patient out of bed to chair and if possible walk her around the hallways Plan is to keep her n.p.o. for next several days, possibly get an upper GI series early next week, and advance diet accordingly.  Kerin Salen, MD 05/24/2021, 8:21 AM

## 2021-05-24 NOTE — Plan of Care (Signed)
  Problem: Activity: Goal: Risk for activity intolerance will decrease Outcome: Progressing   Problem: Coping: Goal: Level of anxiety will decrease Outcome: Progressing   Problem: Elimination: Goal: Will not experience complications related to urinary retention Outcome: Progressing   

## 2021-05-25 ENCOUNTER — Inpatient Hospital Stay (HOSPITAL_COMMUNITY): Payer: 59

## 2021-05-25 LAB — BASIC METABOLIC PANEL
Anion gap: 8 (ref 5–15)
BUN: <5 mg/dL — ABNORMAL LOW (ref 6–20)
CO2: 22 mmol/L (ref 22–32)
Calcium: 8.1 mg/dL — ABNORMAL LOW (ref 8.9–10.3)
Chloride: 108 mmol/L (ref 98–111)
Creatinine, Ser: 0.41 mg/dL — ABNORMAL LOW (ref 0.44–1.00)
GFR, Estimated: >60 mL/min (ref >60)
Glucose, Bld: 104 mg/dL — ABNORMAL HIGH (ref 70–99)
Potassium: 3.7 mmol/L (ref 3.5–5.1)
Sodium: 138 mmol/L (ref 135–145)

## 2021-05-25 LAB — HEPATIC FUNCTION PANEL
ALT: 20 U/L (ref 0–44)
AST: 18 U/L (ref 15–41)
Albumin: 2.4 g/dL — ABNORMAL LOW (ref 3.5–5.0)
Alkaline Phosphatase: 46 U/L (ref 38–126)
Bilirubin, Direct: 0.3 mg/dL — ABNORMAL HIGH (ref 0.0–0.2)
Indirect Bilirubin: 0.8 mg/dL (ref 0.3–0.9)
Total Bilirubin: 1.1 mg/dL (ref 0.3–1.2)
Total Protein: 5 g/dL — ABNORMAL LOW (ref 6.5–8.1)

## 2021-05-25 LAB — CULTURE, BLOOD (ROUTINE X 2)
Culture: NO GROWTH
Special Requests: ADEQUATE

## 2021-05-25 LAB — LIPASE, BLOOD: Lipase: 137 U/L — ABNORMAL HIGH (ref 11–51)

## 2021-05-25 LAB — CBC
HCT: 30 % — ABNORMAL LOW (ref 36.0–46.0)
Hemoglobin: 10.1 g/dL — ABNORMAL LOW (ref 12.0–15.0)
MCH: 30.7 pg (ref 26.0–34.0)
MCHC: 33.7 g/dL (ref 30.0–36.0)
MCV: 91.2 fL (ref 80.0–100.0)
Platelets: 179 10*3/uL (ref 150–400)
RBC: 3.29 MIL/uL — ABNORMAL LOW (ref 3.87–5.11)
RDW: 13.3 % (ref 11.5–15.5)
WBC: 7.7 10*3/uL (ref 4.0–10.5)
nRBC: 0 % (ref 0.0–0.2)

## 2021-05-25 MED ORDER — GABAPENTIN 300 MG PO CAPS
300.0000 mg | ORAL_CAPSULE | Freq: Three times a day (TID) | ORAL | Status: DC
  Administered 2021-05-25 – 2021-05-30 (×14): 300 mg via ORAL
  Filled 2021-05-25 (×13): qty 1

## 2021-05-25 MED ORDER — OXYCODONE HCL 5 MG PO TABS
5.0000 mg | ORAL_TABLET | ORAL | Status: DC | PRN
  Administered 2021-05-29: 5 mg via ORAL
  Filled 2021-05-25: qty 1

## 2021-05-25 MED ORDER — ONDANSETRON HCL 4 MG PO TABS: 4.0000 mg | ORAL_TABLET | Freq: Four times a day (QID) | ORAL | Status: DC | PRN

## 2021-05-25 MED ORDER — ACETAMINOPHEN 160 MG/5ML PO SOLN: 650.0000 mg | Freq: Four times a day (QID) | ORAL | Status: DC

## 2021-05-25 MED ORDER — ONDANSETRON HCL 4 MG/2ML IJ SOLN: 4.0000 mg | Freq: Four times a day (QID) | INTRAMUSCULAR | Status: DC | PRN

## 2021-05-25 MED ORDER — OXYCODONE HCL 5 MG PO TABS: 10.0000 mg | ORAL_TABLET | ORAL | Status: DC | PRN

## 2021-05-25 MED ORDER — ACETAMINOPHEN 325 MG PO TABS
650.0000 mg | ORAL_TABLET | Freq: Four times a day (QID) | ORAL | Status: DC
  Administered 2021-05-25 – 2021-05-30 (×18): 650 mg via ORAL
  Filled 2021-05-25 (×18): qty 2

## 2021-05-25 MED ORDER — DOCUSATE SODIUM 100 MG PO CAPS
100.0000 mg | ORAL_CAPSULE | Freq: Two times a day (BID) | ORAL | Status: DC
  Administered 2021-05-26 – 2021-05-30 (×6): 100 mg via ORAL
  Filled 2021-05-25 (×9): qty 1

## 2021-05-25 MED ORDER — DIATRIZOATE MEGLUMINE & SODIUM 66-10 % PO SOLN
120.0000 mL | Freq: Once | ORAL | Status: AC
  Administered 2021-05-25: 120 mL
  Filled 2021-05-25: qty 120

## 2021-05-25 NOTE — Anesthesia Postprocedure Evaluation (Signed)
Anesthesia Post Note  Patient: Cassandra Peters  Procedure(s) Performed: ENDOSCOPIC RETROGRADE CHOLANGIOPANCREATOGRAPHY (ERCP) WITH PROPOFOL PANCREATIC STENT PLACEMENT SPHINCTEROTOMY BILIARY STENT PLACEMENT     Patient location during evaluation: Endoscopy Anesthesia Type: General Level of consciousness: awake and patient cooperative Pain management: pain level controlled Vital Signs Assessment: post-procedure vital signs reviewed and stable Respiratory status: spontaneous breathing, nonlabored ventilation, respiratory function stable and patient connected to nasal cannula oxygen Cardiovascular status: stable Postop Assessment: no apparent nausea or vomiting Anesthetic complications: no   No notable events documented.  Last Vitals:  Vitals:   05/24/21 1944 05/25/21 0522  BP: 120/78 129/84  Pulse: 68 (!) 59  Resp: 17 16  Temp: 37.4 C 36.8 C  SpO2: 96% 100%    Last Pain:  Vitals:   05/25/21 0522  TempSrc: Oral  PainSc:                  Victor Langenbach

## 2021-05-25 NOTE — Progress Notes (Signed)
2 Days Post-Op   Chief Complaint/Subjective: Pain controlled, soreness in her throat, up and down in the room a lot overnight  Review of Systems See above, otherwise other systems negative  PMH -  has a past medical history of COVID-19 (03/2021), Dyspnea, GERD (gastroesophageal reflux disease), Gestational diabetes, and Pre-diabetes. PSH -  has a past surgical history that includes No past surgeries; Cholecystectomy (N/A, 05/15/2021); Cholecystectomy (N/A, 05/22/2021); Intraoperative cholangiogram (N/A, 05/22/2021); laparotomy (N/A, 05/23/2021); Endoscopic retrograde cholangiopancreatography (ercp) with propofol (N/A, 05/22/2021); pancreatic stent placement (05/22/2021); sphincterotomy (05/22/2021); and biliary stent placement (N/A, 05/22/2021).  [facility] - family history includes Diabetes in her mother; Hypertension in her mother.   Objective: Vital signs in last 24 hours: Temp:  [98.3 F (36.8 C)-99.3 F (37.4 C)] 98.3 F (36.8 C) (09/16 0522) Pulse Rate:  [59-70] 59 (09/16 0522) Resp:  [16-18] 16 (09/16 0522) BP: (116-129)/(76-84) 129/84 (09/16 0522) SpO2:  [96 %-100 %] 100 % (09/16 0522) Last BM Date: 05/19/21 Intake/Output from previous day: 09/15 0701 - 09/16 0700 In: 4371.1 [I.V.:4006.4; IV Piggyback:364.7] Out: 1105 [Urine:850; Emesis/NG output:50; Drains:205] Intake/Output this shift: No intake/output data recorded.  PE: Gen: NAD Resp: nonlabored Card: RRR Abd: soft, drains with serous output, minimal NG output, nontender  Lab Results:  Recent Labs    05/24/21 0232 05/25/21 0019  WBC 7.4 7.7  HGB 10.4* 10.1*  HCT 30.6* 30.0*  PLT 201 179   BMET Recent Labs    05/24/21 0232 05/25/21 0019  NA 142 138  K 3.3* 3.7  CL 114* 108  CO2 23 22  GLUCOSE 121* 104*  BUN 8 <5*  CREATININE 0.56 0.41*  CALCIUM 7.8* 8.1*   PT/INR No results for input(s): LABPROT, INR in the last 72 hours. CMP     Component Value Date/Time   NA 138 05/25/2021 0019   NA 145 (H) 08/13/2016  1049   K 3.7 05/25/2021 0019   CL 108 05/25/2021 0019   CO2 22 05/25/2021 0019   GLUCOSE 104 (H) 05/25/2021 0019   BUN <5 (L) 05/25/2021 0019   BUN 12 08/13/2016 1049   CREATININE 0.41 (L) 05/25/2021 0019   CREATININE 0.59 05/14/2016 1227   CALCIUM 8.1 (L) 05/25/2021 0019   PROT 5.0 (L) 05/25/2021 0019   PROT 6.8 08/13/2016 1049   ALBUMIN 2.4 (L) 05/25/2021 0019   ALBUMIN 4.5 08/13/2016 1049   AST 18 05/25/2021 0019   ALT 20 05/25/2021 0019   ALKPHOS 46 05/25/2021 0019   BILITOT 1.1 05/25/2021 0019   BILITOT <0.2 08/13/2016 1049   GFRNONAA >60 05/25/2021 0019   GFRNONAA >89 05/14/2016 1227   GFRAA 141 08/13/2016 1049   GFRAA >89 05/14/2016 1227   Lipase     Component Value Date/Time   LIPASE 137 (H) 05/25/2021 0019    Studies/Results: DG Abd 1 View  Result Date: 05/23/2021 CLINICAL DATA:  NG tube insertion. EXAM: ABDOMEN - 1 VIEW COMPARISON:  CT abdomen and pelvis 05/22/2021. FINDINGS: Nasogastric tube tip is at the level of the gastric antrum. There is gaseous distention of large and small bowel without definite bowel obstruction. Catheter overlies the right abdomen. No fractures are seen. IMPRESSION: Nasogastric tube tip at the level of the gastric antrum. Electronically Signed   By: Darliss Cheney M.D.   On: 05/23/2021 19:52    Anti-infectives: Anti-infectives (From admission, onward)    Start     Dose/Rate Route Frequency Ordered Stop   05/22/21 1500  piperacillin-tazobactam (ZOSYN) IVPB 3.375 g  3.375 g 12.5 mL/hr over 240 Minutes Intravenous Every 8 hours 05/22/21 1410     05/20/21 0300  piperacillin-tazobactam (ZOSYN) IVPB 3.375 g        3.375 g 100 mL/hr over 30 Minutes Intravenous  Once 05/20/21 0248 05/20/21 0326      Assessment/Plan  40 yo female s/p lap chole with post op leak, lap drain placement and CBD repair, ERCP and then EGD for stent removal. Drains with some concern for bilious output -continue NG tube -cepacol for sore  throat -ambulate/PT -upper GI by gastrograffin, can use NG tube, to evaluate duodenum   LOS: 4 days   [facility] 05/25/2021, 7:20 AM Please see Amion for pager number during day hours 7:00am-4:30pm or 7:00am -11:30am on weekends

## 2021-05-25 NOTE — Progress Notes (Signed)
Subjective: Patient seen and examined at bedside in presence of her husband. She had a large loose bowel movement today morning. She has been able to get out of bed to chair with support and walk with physical therapy yesterday. Denies nausea. Denies abdominal pain, states she has mild left upper quadrant discomfort.  Objective: Vital signs in last 24 hours: Temp:  [98.3 F (36.8 C)-99.3 F (37.4 C)] 98.3 F (36.8 C) (09/16 0745) Pulse Rate:  [59-70] 65 (09/16 0745) Resp:  [16-18] 18 (09/16 0745) BP: (116-129)/(76-84) 117/81 (09/16 0745) SpO2:  [96 %-100 %] 99 % (09/16 0745) Weight change:  Last BM Date: 05/19/21  PE: Not in distress GENERAL: NG tube draining bilious fluid , 350 cc removed last 24 hours and 50 cc so far since last shift ABDOMEN: Drains in place, total output 205 cc, slightly bile tinged, mostly serosanguineous EXTREMITIES: Swelling of hands and feet has markedly improved Lab Results: Results for orders placed or performed during the hospital encounter of 05/20/21 (from the past 48 hour(s))  Type and screen [facility]     Status: None (Preliminary result)   Collection Time: 05/23/21  2:05 PM  Result Value Ref Range   ABO/RH(D) O POS    Antibody Screen NEG    Sample Expiration 05/26/2021,2359    Unit Number [id number]    Blood Component Type RBC LR PHER2    Unit division 00    Status of Unit ALLOCATED    Transfusion Status OK TO TRANSFUSE    Crossmatch Result      Compatible Performed at [facility], [address], [place], [place] [address]    Unit Number [id number]    Blood Component Type RED CELLS,LR    Unit division 00    Status of Unit ALLOCATED    Transfusion Status OK TO TRANSFUSE    Crossmatch Result Compatible   Prepare RBC (crossmatch)     Status: None   Collection Time: 05/23/21  2:07 PM  Result Value Ref Range   Order Confirmation      ORDER PROCESSED BY BLOOD BANK Performed at [facility], [address] [place], [place] [address]   Lipase, blood     Status: Abnormal   Collection Time: 05/24/21  2:32 AM  Result Value Ref Range   Lipase 134 (H) 11 - 51 U/L    Comment: Performed at [facility], [address] [place], [place] [address]  CBC     Status: Abnormal   Collection Time: 05/24/21  2:32 AM  Result Value Ref Range   WBC 7.4 4.0 - 10.5 K/uL   RBC 3.38 (L) 3.87 - 5.11 MIL/uL   Hemoglobin 10.4 (L) 12.0 - 15.0 g/dL   HCT 81.2 (L) 75.1 - 70.0 %   MCV 90.5 80.0 - 100.0 fL   MCH 30.8 26.0 - 34.0 pg   MCHC 34.0 30.0 - 36.0 g/dL   RDW 17.4 94.4 - 96.7 %   Platelets 201 150 - 400 K/uL   nRBC 0.0 0.0 - 0.2 %    Comment: Performed at [facility], [address], [place], [place] [address]  Comprehensive metabolic panel     Status: Abnormal   Collection Time: 05/24/21  2:32 AM  Result Value Ref Range   Sodium 142 135 - 145 mmol/L   Potassium 3.3 (L) 3.5 - 5.1 mmol/L   Chloride 114 (H) 98 - 111 mmol/L   CO2 23 22 -  32 mmol/L   Glucose, Bld 121 (H) 70 - 99 mg/dL    Comment: Glucose reference range applies only to samples taken after fasting for at least 8 hours.   BUN 8 6 - 20 mg/dL   Creatinine, Ser 1.60 0.44 - 1.00 mg/dL   Calcium 7.8 (L) 8.9 - 10.3 mg/dL   Total Protein 4.8 (L) 6.5 - 8.1 g/dL   Albumin 2.3 (L) 3.5 - 5.0 g/dL   AST 21 15 - 41 U/L   ALT 22 0 - 44 U/L   Alkaline Phosphatase 49 38 - 126 U/L   Total Bilirubin 1.2 0.3 - 1.2 mg/dL   GFR, Estimated >73 >71 mL/min    Comment: (NOTE) Calculated using the CKD-EPI Creatinine Equation (2021)    Anion gap 5 5 - 15    Comment: Performed at [facility] Lab, [address], [place], [place] [address]  Basic metabolic panel     Status: Abnormal   Collection Time: 05/25/21 12:19 AM  Result Value Ref Range   Sodium 138 135 - 145 mmol/L   Potassium 3.7 3.5 - 5.1 mmol/L   Chloride 108 98 - 111 mmol/L   CO2 22 22 - 32 mmol/L   Glucose, Bld 104 (H) 70 - 99 mg/dL    Comment: Glucose reference range  applies only to samples taken after fasting for at least 8 hours.   BUN <5 (L) 6 - 20 mg/dL   Creatinine, Ser 4.85 (L) 0.44 - 1.00 mg/dL   Calcium 8.1 (L) 8.9 - 10.3 mg/dL   GFR, Estimated >46 >27 mL/min    Comment: (NOTE) Calculated using the CKD-EPI Creatinine Equation (2021)    Anion gap 8 5 - 15    Comment: Performed at [facility], [address], [place], [place] [address]  Hepatic function panel     Status: Abnormal   Collection Time: 05/25/21 12:19 AM  Result Value Ref Range   Total Protein 5.0 (L) 6.5 - 8.1 g/dL   Albumin 2.4 (L) 3.5 - 5.0 g/dL   AST 18 15 - 41 U/L   ALT 20 0 - 44 U/L   Alkaline Phosphatase 46 38 - 126 U/L   Total Bilirubin 1.1 0.3 - 1.2 mg/dL   Bilirubin, Direct 0.3 (H) 0.0 - 0.2 mg/dL   Indirect Bilirubin 0.8 0.3 - 0.9 mg/dL    Comment: Performed at [facility], [address], [place], [place] [address]  Lipase, blood     Status: Abnormal   Collection Time: 05/25/21 12:19 AM  Result Value Ref Range   Lipase 137 (H) 11 - 51 U/L    Comment: Performed at [facility], [address] [place], [place] [address]  CBC     Status: Abnormal   Collection Time: 05/25/21 12:19 AM  Result Value Ref Range   WBC 7.7 4.0 - 10.5 K/uL   RBC 3.29 (L) 3.87 - 5.11 MIL/uL   Hemoglobin 10.1 (L) 12.0 - 15.0 g/dL   HCT 16.9 (L) 67.8 - 93.8 %   MCV 91.2 80.0 - 100.0 fL   MCH 30.7 26.0 - 34.0 pg   MCHC 33.7 30.0 - 36.0 g/dL   RDW 10.1 75.1 - 02.5 %   Platelets 179 150 - 400 K/uL   nRBC 0.0 0.0 - 0.2 %    Comment: Performed at [facility] Lab, [address] [place], [place] [address]    Studies/Results: DG Abd 1 View  Result  Date: 05/23/2021 CLINICAL DATA:  NG tube insertion. EXAM: ABDOMEN - 1 VIEW COMPARISON:  CT abdomen and pelvis 05/22/2021. FINDINGS: Nasogastric tube tip is at the level of the gastric antrum. There is gaseous distention of large and small bowel without definite bowel obstruction. Catheter overlies the right abdomen. No  fractures are seen. IMPRESSION: Nasogastric tube tip at the level of the gastric antrum. Electronically Signed   By: Darliss Cheney M.D.   On: 05/23/2021 19:52    Medications: I have reviewed the patient's current medications.  Assessment: Status post lap chole, bile leak, CBD repair, lab drain in place ERCP with biliary stent placed, malpositioned stent, removed subsequently by EGD Normal electrolytes, normal renal function, normal LFTs Normal WBC, mild anemia hemoglobin 10.1 Lipase 137  Plan: Upper GI by Gastrografin being planned Continue IV fluids, IV antibiotics and early/progressive mobilization  Kerin Salen, MD 05/25/2021, 8:14 AM

## 2021-05-25 NOTE — Progress Notes (Signed)
pt is on npo, meds are thru ngt tube, but ngt d/c in am,  called MD on call to report and ordered meds can be given po, pharmacy called to change.

## 2021-05-26 LAB — BPAM RBC
Blood Product Expiration Date: 202210182359
Blood Product Expiration Date: 202210182359
ISSUE DATE / TIME: 202209132203
ISSUE DATE / TIME: 202209132203
Unit Type and Rh: 5100
Unit Type and Rh: 5100

## 2021-05-26 LAB — URINALYSIS, MICROSCOPIC (REFLEX)

## 2021-05-26 LAB — HEPATIC FUNCTION PANEL
ALT: 18 U/L (ref 0–44)
AST: 17 U/L (ref 15–41)
Albumin: 2.4 g/dL — ABNORMAL LOW (ref 3.5–5.0)
Alkaline Phosphatase: 50 U/L (ref 38–126)
Bilirubin, Direct: 0.2 mg/dL (ref 0.0–0.2)
Indirect Bilirubin: 0.9 mg/dL (ref 0.3–0.9)
Total Bilirubin: 1.1 mg/dL (ref 0.3–1.2)
Total Protein: 5.1 g/dL — ABNORMAL LOW (ref 6.5–8.1)

## 2021-05-26 LAB — URINALYSIS, ROUTINE W REFLEX MICROSCOPIC
Bilirubin Urine: NEGATIVE
Glucose, UA: NEGATIVE mg/dL
Ketones, ur: >80 mg/dL — AB
Leukocytes,Ua: NEGATIVE
Nitrite: NEGATIVE
Protein, ur: NEGATIVE mg/dL
Specific Gravity, Urine: 1.01 (ref 1.005–1.030)
pH: 6 (ref 5.0–8.0)

## 2021-05-26 LAB — CBC
HCT: 30.4 % — ABNORMAL LOW (ref 36.0–46.0)
Hemoglobin: 10.2 g/dL — ABNORMAL LOW (ref 12.0–15.0)
MCH: 30.5 pg (ref 26.0–34.0)
MCHC: 33.6 g/dL (ref 30.0–36.0)
MCV: 91 fL (ref 80.0–100.0)
Platelets: 190 10*3/uL (ref 150–400)
RBC: 3.34 MIL/uL — ABNORMAL LOW (ref 3.87–5.11)
RDW: 13.1 % (ref 11.5–15.5)
WBC: 7.8 10*3/uL (ref 4.0–10.5)
nRBC: 0 % (ref 0.0–0.2)

## 2021-05-26 LAB — TYPE AND SCREEN
ABO/RH(D): O POS
Antibody Screen: NEGATIVE
Unit division: 0
Unit division: 0

## 2021-05-26 LAB — BASIC METABOLIC PANEL
Anion gap: 11 (ref 5–15)
BUN: <5 mg/dL — ABNORMAL LOW (ref 6–20)
CO2: 22 mmol/L (ref 22–32)
Calcium: 8.5 mg/dL — ABNORMAL LOW (ref 8.9–10.3)
Chloride: 105 mmol/L (ref 98–111)
Creatinine, Ser: 0.52 mg/dL (ref 0.44–1.00)
GFR, Estimated: >60 mL/min (ref >60)
Glucose, Bld: 82 mg/dL (ref 70–99)
Potassium: 3.5 mmol/L (ref 3.5–5.1)
Sodium: 138 mmol/L (ref 135–145)

## 2021-05-26 NOTE — Progress Notes (Signed)
3 Days Post-Op    CC:  Subjective: No nausea, C/O some suprapubic pain  Objective: Vital signs in last 24 hours: Temp:  [97.5 F (36.4 C)-98.9 F (37.2 C)] 97.5 F (36.4 C) (09/17 0500) Pulse Rate:  [58-69] 68 (09/17 0500) Resp:  [18] 18 (09/17 0500) BP: (118-138)/(73-83) 118/73 (09/17 0500) SpO2:  [99 %-100 %] 99 % (09/17 0500) Last BM Date: 05/25/21 NP oh 1126 IV Urine x3 Right anterior drain 40 Right lateral drain 110 BM x3 Afebrile vital signs are stable CMP is stable. WBC 7.8 Intake/Output from previous day: 09/16 0701 - 09/17 0700 In: 1126.2 [I.V.:1053.2; IV Piggyback:72.9] Out: 150 [Drains:150] Intake/Output this shift: No intake/output data recorded.  General appearance: cooperative Resp: clear to auscultation bilaterally Cardio: regular rate and rhythm GI: soft, drains SS, NT  Lab Results:  Recent Labs    05/25/21 0019 05/26/21 0202  WBC 7.7 7.8  HGB 10.1* 10.2*  HCT 30.0* 30.4*  PLT 179 190    BMET Recent Labs    05/25/21 0019 05/26/21 0202  NA 138 138  K 3.7 3.5  CL 108 105  CO2 22 22  GLUCOSE 104* 82  BUN <5* <5*  CREATININE 0.41* 0.52  CALCIUM 8.1* 8.5*   PT/INR No results for input(s): LABPROT, INR in the last 72 hours.  Recent Labs  Lab 05/21/21 0410 05/23/21 0247 05/24/21 0232 05/25/21 0019 05/26/21 0202  AST 15 45* 21 18 17   ALT 17 34 22 20 18   ALKPHOS 39 53 49 46 50  BILITOT 1.2 1.4* 1.2 1.1 1.1  PROT 5.9* 5.4* 4.8* 5.0* 5.1*  ALBUMIN 3.5 3.0* 2.3* 2.4* 2.4*     Lipase     Component Value Date/Time   LIPASE 137 (H) 05/25/2021 0019     Medications:  sodium chloride   Intravenous Once   acetaminophen  650 mg Oral Q6H   Chlorhexidine Gluconate Cloth  6 each Topical Daily   docusate sodium  100 mg Oral BID   enoxaparin (LOVENOX) injection  40 mg Subcutaneous Q24H   gabapentin  300 mg Oral Q8H   mouth rinse  15 mL Mouth Rinse BID   pantoprazole (PROTONIX) IV  40 mg Intravenous Q12H    0.9 % NaCl with KCl  20 mEq / L 125 mL/hr at 05/25/21 1701   lactated ringers 100 mL/hr at 05/23/21 1528   methocarbamol (ROBAXIN) IV     piperacillin-tazobactam (ZOSYN)  IV 3.375 g (05/26/21 0537)    Assessment/Plan  Laparoscopic cholecystectomy 05/15/2021, Dr. 05/28/21 Kinsinger Readmitted 05/20/2021 with bile leak. ERCP with biliary stent placement 05/22/2021,[facility] Laparoscopic repair of bile leak and abdominal washout intraoperative cholangiogram, intraperitoneal drain placement 05/22/2021 [facility] Stechschulte POD #5 - start clears - decrease IVF - check U/A for suprapubic pain. Blood in urine but is on period.  FEN: CL, IVF 75/h ID: Zosyn 9/13 >> day 4 DVT: Lovenox  Hx COVID-19 GERD Gestational diabetes      LOS: 5 days    JENNINGS,WILLARD 05/26/2021 Please see Amion

## 2021-05-26 NOTE — Progress Notes (Addendum)
[facility] Gastroenterology Progress Note  Cassandra Peters 40 y.o. Nov 21, 1980  CC: Abdominal pain   Subjective: Patient seen and examined at bedside.  Feeling somewhat better.  Complaining of suprapubic abdominal pain today.  Denies nausea and vomiting.  History obtained with help of [employer] interpreter software.  ROS : Afebrile, negative for chest pain   Objective: Vital signs in last 24 hours: Vitals:   05/26/21 0500 05/26/21 0951  BP: 118/73 112/75  Pulse: 68 70  Resp: 18 18  Temp: (!) 97.5 F (36.4 C) 98 F (36.7 C)  SpO2: 99% 99%    Physical Exam:  General : Resting comfortably, not in acute distress Abdomen : Soft, nontender, no peritoneal signs.  Bowel sounds present.  Mild suprapubic discomfort on palpation Neuro : A/O X 3  Psych : Affect normal   Lab Results: Recent Labs    05/25/21 0019 05/26/21 0202  NA 138 138  K 3.7 3.5  CL 108 105  CO2 22 22  GLUCOSE 104* 82  BUN <5* <5*  CREATININE 0.41* 0.52  CALCIUM 8.1* 8.5*   Recent Labs    05/25/21 0019 05/26/21 0202  AST 18 17  ALT 20 18  ALKPHOS 46 50  BILITOT 1.1 1.1  PROT 5.0* 5.1*  ALBUMIN 2.4* 2.4*   Recent Labs    05/25/21 0019 05/26/21 0202  WBC 7.7 7.8  HGB 10.1* 10.2*  HCT 30.0* 30.4*  MCV 91.2 91.0  PLT 179 190   No results for input(s): LABPROT, INR in the last 72 hours.    Assessment/Plan: -Abdominal pain in a patient with multiple recent procedures.  S/p lap chole which was complicated by bile leak.  She underwent ERCP on September 13 which was technically difficult.  Sphincterotomy was performed and plastic stent was placed in the CBD.  Follow-up CT scan on the same day showed free air in the retroperitoneum as well as malpositioned CBD stent.  She underwent laparoscopic repair of bile leak , abdominal washout and intraperitoneal drain placement on May 22, 2021.  EGD for biliary stent removal on May 23, 2021.  Gastrografin upper GI series as well as CT abdomen without  contrast yesterday showed no extraluminal contrast to indicate leak.  Recommendations --------------------------- -Continue supportive care for now -Continue clear liquid diet today -LFTs normal.  Normal white counts. -GI will follow    Kathi Der MD, FACP 05/26/2021, 11:48 AM  Contact #  [phone]

## 2021-05-26 NOTE — Plan of Care (Signed)
  Problem: Education: Goal: Knowledge of General Education information will improve Description Including pain rating scale, medication(s)/side effects and non-pharmacologic comfort measures Outcome: Progressing   

## 2021-05-27 LAB — BASIC METABOLIC PANEL
Anion gap: 10 (ref 5–15)
BUN: <5 mg/dL — ABNORMAL LOW (ref 6–20)
CO2: 24 mmol/L (ref 22–32)
Calcium: 8.5 mg/dL — ABNORMAL LOW (ref 8.9–10.3)
Chloride: 106 mmol/L (ref 98–111)
Creatinine, Ser: 0.55 mg/dL (ref 0.44–1.00)
GFR, Estimated: >60 mL/min (ref >60)
Glucose, Bld: 91 mg/dL (ref 70–99)
Potassium: 3.5 mmol/L (ref 3.5–5.1)
Sodium: 140 mmol/L (ref 135–145)

## 2021-05-27 LAB — HEPATIC FUNCTION PANEL
ALT: 21 U/L (ref 0–44)
AST: 21 U/L (ref 15–41)
Albumin: 2.4 g/dL — ABNORMAL LOW (ref 3.5–5.0)
Alkaline Phosphatase: 52 U/L (ref 38–126)
Bilirubin, Direct: 0.2 mg/dL (ref 0.0–0.2)
Indirect Bilirubin: 0.4 mg/dL (ref 0.3–0.9)
Total Bilirubin: 0.6 mg/dL (ref 0.3–1.2)
Total Protein: 5.2 g/dL — ABNORMAL LOW (ref 6.5–8.1)

## 2021-05-27 LAB — TYPE AND SCREEN
ABO/RH(D): O POS
Antibody Screen: NEGATIVE
Unit division: 0
Unit division: 0

## 2021-05-27 LAB — CBC
HCT: 30.3 % — ABNORMAL LOW (ref 36.0–46.0)
Hemoglobin: 10.2 g/dL — ABNORMAL LOW (ref 12.0–15.0)
MCH: 30.8 pg (ref 26.0–34.0)
MCHC: 33.7 g/dL (ref 30.0–36.0)
MCV: 91.5 fL (ref 80.0–100.0)
Platelets: 221 10*3/uL (ref 150–400)
RBC: 3.31 MIL/uL — ABNORMAL LOW (ref 3.87–5.11)
RDW: 13 % (ref 11.5–15.5)
WBC: 6.3 10*3/uL (ref 4.0–10.5)
nRBC: 0 % (ref 0.0–0.2)

## 2021-05-27 LAB — BPAM RBC
Blood Product Expiration Date: 202210132359
Blood Product Expiration Date: 202210132359
Unit Type and Rh: 5100
Unit Type and Rh: 5100

## 2021-05-27 MED ORDER — HYDROMORPHONE HCL 1 MG/ML IJ SOLN: 0.5000 mg | INTRAMUSCULAR | Status: DC | PRN

## 2021-05-27 NOTE — Progress Notes (Signed)
[facility] Gastroenterology Progress Note  Cassandra Peters 40 y.o. 1981-03-24  CC: Abdominal pain   Subjective: Patient seen and examined at bedside.  Status interpreter software used for history taking.  Feeling somewhat better.  Suprapubic discomfort improved.  Feeling hungry.  ROS : Afebrile, negative for chest pain   Objective: Vital signs in last 24 hours: Vitals:   05/27/21 0434 05/27/21 0955  BP: 133/80 116/76  Pulse: (!) 59 75  Resp: 17 16  Temp: 98.1 F (36.7 C) 97.7 F (36.5 C)  SpO2: 99% 100%    Physical Exam:  General : Resting comfortably, not in acute distress Abdomen : Soft, nontender, no peritoneal signs.  Bowel sounds present.  Drains in place. Neuro : A/O X 3  Psych : Affect normal   Lab Results: Recent Labs    05/26/21 0202 05/27/21 0308  NA 138 140  K 3.5 3.5  CL 105 106  CO2 22 24  GLUCOSE 82 91  BUN <5* <5*  CREATININE 0.52 0.55  CALCIUM 8.5* 8.5*   Recent Labs    05/26/21 0202 05/27/21 0308  AST 17 21  ALT 18 21  ALKPHOS 50 52  BILITOT 1.1 0.6  PROT 5.1* 5.2*  ALBUMIN 2.4* 2.4*   Recent Labs    05/26/21 0202 05/27/21 0308  WBC 7.8 6.3  HGB 10.2* 10.2*  HCT 30.4* 30.3*  MCV 91.0 91.5  PLT 190 221   No results for input(s): LABPROT, INR in the last 72 hours.    Assessment/Plan: -Abdominal pain in a patient with multiple recent procedures.  S/p lap chole which was complicated by bile leak.  She underwent ERCP on September 13 which was technically difficult.  Sphincterotomy was performed and plastic stent was placed in the CBD.  Follow-up CT scan on the same day showed free air in the retroperitoneum as well as malpositioned CBD stent.  She underwent laparoscopic repair of bile leak , abdominal washout and intraperitoneal drain placement on May 22, 2021.  EGD for biliary stent removal on May 23, 2021.  Gastrografin upper GI series as well as CT abdomen without contrast yesterday showed no extraluminal contrast to  indicate leak.  Recommendations --------------------------- -Continue supportive care  -Patient's abdominal pain has improved significantly.  She is feeling hungry.  Advance diet to full liquid. -GI will follow    Kathi Der MD, FACP 05/27/2021, 12:25 PM  Contact #  [phone]

## 2021-05-27 NOTE — Progress Notes (Signed)
4 Days Post-Op    CC: Abdominal pain  Subjective: Patient is tolerating clear liquids well.  She has 2 drains in place and they both have colostomy bags around them along with the JP bulb.  The right lateral seems to have the most and this appears bilious.  The anterior drain is serosanguineous.  She seems to be doing fine pain is well controlled. Interpreter Susy Frizzle was used to talk with the patient. Objective: Vital signs in last 24 hours: Temp:  [97.7 F (36.5 C)-98.3 F (36.8 C)] 97.7 F (36.5 C) (09/18 0955) Pulse Rate:  [55-75] 75 (09/18 0955) Resp:  [16-18] 16 (09/18 0955) BP: (110-133)/(76-80) 116/76 (09/18 0955) SpO2:  [99 %-100 %] 100 % (09/18 0955) Last BM Date: 05/26/21 1200 p.o. IV not recorded Urine x3 Right anterior drain 30 cc Right lateral drain 70 cc Afebrile, vital signs are stable Labs are all stable.  Intake/Output from previous day: 09/17 0701 - 09/18 0700 In: 1200 [P.O.:1200] Out: 100 [Drains:100] Intake/Output this shift: No intake/output data recorded.  General appearance: alert, cooperative, and no distress Resp: clear to auscultation bilaterally GI: Abdomen soft, nondistended, port sites look good.  She has 2 drains in place 1 drain appears bilious, the second drain appears to be just serosanguineous fluid.  Lab Results:  Recent Labs    05/26/21 0202 05/27/21 0308  WBC 7.8 6.3  HGB 10.2* 10.2*  HCT 30.4* 30.3*  PLT 190 221    BMET Recent Labs    05/26/21 0202 05/27/21 0308  NA 138 140  K 3.5 3.5  CL 105 106  CO2 22 24  GLUCOSE 82 91  BUN <5* <5*  CREATININE 0.52 0.55  CALCIUM 8.5* 8.5*   PT/INR No results for input(s): LABPROT, INR in the last 72 hours.  Recent Labs  Lab 05/23/21 0247 05/24/21 0232 05/25/21 0019 05/26/21 0202 05/27/21 0308  AST 45* 21 18 17 21   ALT 34 22 20 18 21   ALKPHOS 53 49 46 50 52  BILITOT 1.4* 1.2 1.1 1.1 0.6  PROT 5.4* 4.8* 5.0* 5.1* 5.2*  ALBUMIN 3.0* 2.3* 2.4* 2.4* 2.4*     Lipase      Component Value Date/Time   LIPASE 137 (H) 05/25/2021 0019     Medications:  sodium chloride   Intravenous Once   acetaminophen  650 mg Oral Q6H   Chlorhexidine Gluconate Cloth  6 each Topical Daily   docusate sodium  100 mg Oral BID   enoxaparin (LOVENOX) injection  40 mg Subcutaneous Q24H   gabapentin  300 mg Oral Q8H   mouth rinse  15 mL Mouth Rinse BID   pantoprazole (PROTONIX) IV  40 mg Intravenous Q12H    0.9 % NaCl with KCl 20 mEq / L 75 mL/hr at 05/27/21 0508   lactated ringers 100 mL/hr at 05/23/21 1528   methocarbamol (ROBAXIN) IV     piperacillin-tazobactam (ZOSYN)  IV 3.375 g (05/27/21 0506)     Assessment/Plan Laparoscopic cholecystectomy 05/15/2021, Dr. 05/29/21 Kinsinger Readmitted 05/20/2021 with bile leak. ERCP with biliary stent placement 05/22/2021,[facility] Laparoscopic repair of bile leak and abdominal washout intraoperative cholangiogram, intraperitoneal drain placement 05/22/2021 Dr. [facility] Stechschulte POD # - start clears - decrease IVF - check U/A for suprapubic pain: Abdomen hemoglobin, she is on her period 0-5 white cells, positive for ketones.  -Plan: Advance to full liquids, will review with Dr. 05/24/2021   FEN: CL, IVF 75/h ID: Renae Fickle 9/13 >> day 6 DVT: Lovenox Pain control: Tylenol/Neurontin  Hx COVID-19 GERD Gestational diabetes        LOS: 6 days    Dorrie Cocuzza 05/27/2021 Please see Amion

## 2021-05-28 MED ORDER — PANTOPRAZOLE SODIUM 40 MG PO TBEC
40.0000 mg | DELAYED_RELEASE_TABLET | Freq: Two times a day (BID) | ORAL | Status: DC
  Administered 2021-05-28 – 2021-05-30 (×4): 40 mg via ORAL
  Filled 2021-05-28 (×4): qty 1

## 2021-05-28 NOTE — Progress Notes (Signed)
5 Days Post-Op   Chief Complaint/Subjective: Some pain in the abd wall at drain exit  Review of Systems See above, otherwise other systems negative   PMH -  has a past medical history of COVID-19 (03/2021), Dyspnea, GERD (gastroesophageal reflux disease), Gestational diabetes, and Pre-diabetes. PSH -  has a past surgical history that includes No past surgeries; Cholecystectomy (N/A, 05/15/2021); Cholecystectomy (N/A, 05/22/2021); Intraoperative cholangiogram (N/A, 05/22/2021); laparotomy (N/A, 05/23/2021); Endoscopic retrograde cholangiopancreatography (ercp) with propofol (N/A, 05/22/2021); pancreatic stent placement (05/22/2021); sphincterotomy (05/22/2021); and biliary stent placement (N/A, 05/22/2021).  [facility] - family history includes Diabetes in her mother; Hypertension in her mother.   Objective: Vital signs in last 24 hours: Temp:  [97.5 F (36.4 C)-97.9 F (36.6 C)] 97.9 F (36.6 C) (09/19 0459) Pulse Rate:  [61-75] 61 (09/19 0459) Resp:  [16-17] 17 (09/19 0459) BP: (116-120)/(70-77) 120/76 (09/19 0459) SpO2:  [100 %] 100 % (09/19 0459) Last BM Date: 05/27/21 Intake/Output from previous day: 09/18 0701 - 09/19 0700 In: 1040 [P.O.:1040] Out: 120 [Drains:120] Intake/Output this shift: No intake/output data recorded.  PE: Gen: NAD Resp: nonlabored Card: RRR Abd: soft, right sided drains with bilious output  Lab Results:  Recent Labs    05/26/21 0202 05/27/21 0308  WBC 7.8 6.3  HGB 10.2* 10.2*  HCT 30.4* 30.3*  PLT 190 221   BMET Recent Labs    05/26/21 0202 05/27/21 0308  NA 138 140  K 3.5 3.5  CL 105 106  CO2 22 24  GLUCOSE 82 91  BUN <5* <5*  CREATININE 0.52 0.55  CALCIUM 8.5* 8.5*   PT/INR No results for input(s): LABPROT, INR in the last 72 hours. CMP     Component Value Date/Time   NA 140 05/27/2021 0308   NA 145 (H) 08/13/2016 1049   K 3.5 05/27/2021 0308   CL 106 05/27/2021 0308   CO2 24 05/27/2021 0308   GLUCOSE 91 05/27/2021 0308   BUN <5  (L) 05/27/2021 0308   BUN 12 08/13/2016 1049   CREATININE 0.55 05/27/2021 0308   CREATININE 0.59 05/14/2016 1227   CALCIUM 8.5 (L) 05/27/2021 0308   PROT 5.2 (L) 05/27/2021 0308   PROT 6.8 08/13/2016 1049   ALBUMIN 2.4 (L) 05/27/2021 0308   ALBUMIN 4.5 08/13/2016 1049   AST 21 05/27/2021 0308   ALT 21 05/27/2021 0308   ALKPHOS 52 05/27/2021 0308   BILITOT 0.6 05/27/2021 0308   BILITOT <0.2 08/13/2016 1049   GFRNONAA >60 05/27/2021 0308   GFRNONAA >89 05/14/2016 1227   GFRAA 141 08/13/2016 1049   GFRAA >89 05/14/2016 1227   Lipase     Component Value Date/Time   LIPASE 137 (H) 05/25/2021 0019    Studies/Results: No results found.  Anti-infectives: Anti-infectives (From admission, onward)    Start     Dose/Rate Route Frequency Ordered Stop   05/22/21 1500  piperacillin-tazobactam (ZOSYN) IVPB 3.375 g        3.375 g 12.5 mL/hr over 240 Minutes Intravenous Every 8 hours 05/22/21 1410     05/20/21 0300  piperacillin-tazobactam (ZOSYN) IVPB 3.375 g        3.375 g 100 mL/hr over 30 Minutes Intravenous  Once 05/20/21 0248 05/20/21 0326       Assessment/Plan 40 yo female s/p lap chole with post op leak, lap drain placement and CBD repair, ERCP and then EGD for stent removal. Drains with bilious output. Given that she her biliary tree was instrumented with complication, she has a sphincterotomy so  we will maintain drainage for 6-8 weeks to allow healing -advance diet -home soon with drains   LOS: 7 days   De Bl[facility] 05/28/2021, 7:58 AM Please see Amion for pager number during day hours 7:00am-4:30pm or 7:00am -11:30am on weekends

## 2021-05-28 NOTE — Progress Notes (Signed)
[facility] Gastroenterology Progress Note  Cassandra Peters 40 y.o. 1981-03-01  CC: Abdominal pain   Subjective: Patient seen and examined at bedside.  Feeling better, diet advanced this AM.  States she is unable to drink milk/yogurt without AB pain and loose stools.  Otherwise tolerating full diet well.  Patient is walking in the hall way without SOB or chest pain.   ROS : Afebrile, negative for chest pain   Objective: Vital signs in last 24 hours: Vitals:   05/28/21 0459 05/28/21 0842  BP: 120/76 113/79  Pulse: 61 78  Resp: 17   Temp: 97.9 F (36.6 C) 97.7 F (36.5 C)  SpO2: 100% 100%    Physical Exam:  General : Resting comfortably in chair, not in acute distress Abdomen : Soft, nontender, no peritoneal signs.  Bowel sounds present.  Drains in place. Neuro : A/O X 3  Psych : Affect normal   Lab Results: Recent Labs    05/26/21 0202 05/27/21 0308  NA 138 140  K 3.5 3.5  CL 105 106  CO2 22 24  GLUCOSE 82 91  BUN <5* <5*  CREATININE 0.52 0.55  CALCIUM 8.5* 8.5*   Recent Labs    05/26/21 0202 05/27/21 0308  AST 17 21  ALT 18 21  ALKPHOS 50 52  BILITOT 1.1 0.6  PROT 5.1* 5.2*  ALBUMIN 2.4* 2.4*   Recent Labs    05/26/21 0202 05/27/21 0308  WBC 7.8 6.3  HGB 10.2* 10.2*  HCT 30.4* 30.3*  MCV 91.0 91.5  PLT 190 221   No results for input(s): LABPROT, INR in the last 72 hours.    Assessment/Plan: -Abdominal pain in a patient with multiple recent procedures.  S/p lap chole which was complicated by bile leak.  She underwent ERCP on September 13 which was technically difficult.  Sphincterotomy was performed and plastic stent was placed in the CBD.  Follow-up CT scan on the same day showed free air in the retroperitoneum as well as malpositioned CBD stent.  She underwent laparoscopic repair of bile leak , abdominal washout and intraperitoneal drain placement on May 22, 2021.  EGD for biliary stent removal on May 23, 2021.  Gastrografin upper  GI series as well as CT abdomen without contrast on 05/25/2021:  showed no extraluminal contrast to indicate leak.  Recommendations --------------------------- -Continue supportive care, continue early mobilization. -Patient's abdominal pain has improved significantly.   -Regular diet at this time, tolerating well.  -GI will follow    Doree Albee PA-C 05/28/2021, 8:47 AM

## 2021-05-28 NOTE — Plan of Care (Signed)
  Problem: Education: Goal: Knowledge of General Education information will improve Description Including pain rating scale, medication(s)/side effects and non-pharmacologic comfort measures Outcome: Progressing   

## 2021-05-28 NOTE — Progress Notes (Signed)
Physical Therapy Treatment Patient Details Name: Cassandra Peters MRN: [id number] DOB: Sep 28, 1980 Today's Date: 05/28/2021   History of Present Illness Pt is a 40yo female presenting to [facility] ED on 9/11 with severe abdominal pain, nausea, and vomiting. She is recently s/p laparoscopic cholecystectomy on 9/6. Endoscopic retrograde cholangiopancreatography was completed 9/13 but the stent was misaligned so pt underwent a laparoscopic repair of bile leak and abdominal washout that same day. PMH: COVID 03/2021.    PT Comments    Pt was seen for mobility from bed to chair with a walk.  Her pain complaints were minimal.  Translation was obtained for the visit, but pt does speak some [place].  Min guard to S for gait, noting her control of turning and adjustments of speed did not require additional help.  Follow along with her to get home with stair climbing practice next visit as needed.  Recommendations for follow up therapy are one component of a multi-disciplinary discharge planning process, led by the attending physician.  Recommendations may be updated based on patient status, additional functional criteria and insurance authorization.  Follow Up Recommendations  No PT follow up     Equipment Recommendations  None recommended by PT    Recommendations for Other Services       Precautions / Restrictions Precautions Precautions: Other (comment) (abd incision) Restrictions Weight Bearing Restrictions: No     Mobility  Bed Mobility Overal bed mobility: Needs Assistance Bed Mobility: Rolling;Sidelying to Sit Rolling: Supervision Sidelying to sit: Supervision       General bed mobility comments: pt got up without PT physically assisting    Transfers Overall transfer level: Needs assistance Equipment used: None Transfers: Sit to/from Stand Sit to Stand: Supervision;Min guard         General transfer comment: Min guard for safety  Ambulation/Gait Ambulation/Gait assistance: Min  guard Gait Distance (Feet): 200 Feet Assistive device: None Gait Pattern/deviations: Step-through pattern;Decreased stride length;Narrow base of support Gait velocity: decreased Gait velocity interpretation: <1.31 ft/sec, indicative of household ambulator General Gait Details: min guard on hallway, pt is moving with more confident presentation   Stairs             Wheelchair Mobility    Modified Rankin (Stroke Patients Only)       Balance Overall balance assessment: Needs assistance Sitting-balance support: No upper extremity supported;Feet supported Sitting balance-Leahy Scale: Good   Postural control: Posterior lean Standing balance support: No upper extremity supported;During functional activity Standing balance-Leahy Scale: Fair                              Cognition Arousal/Alertness: Awake/alert Behavior During Therapy: WFL for tasks assessed/performed Overall Cognitive Status: Within Functional Limits for tasks assessed                                 General Comments: no complaints via interpreter      Exercises      General Comments General comments (skin integrity, edema, etc.): pt was seen for mobility on hallway with no AD needed, min guard for safety given recent procedure      Pertinent Vitals/Pain Pain Assessment: 0-10 Pain Score: 3  Pain Location: abd with iniation of mobility Pain Descriptors / Indicators: Guarding Pain Intervention(s): Limited activity within patient's tolerance;Monitored during session;Repositioned    Home Living  Prior Function            PT Goals (current goals can now be found in the care plan section) Acute Rehab PT Goals Patient Stated Goal: less pain via interpreter Progress towards PT goals: Progressing toward goals    Frequency    Min 3X/week      PT Plan Current plan remains appropriate    Co-evaluation              AM-PAC PT "6  Clicks" Mobility   Outcome Measure  Help needed turning from your back to your side while in a flat bed without using bedrails?: None Help needed moving from lying on your back to sitting on the side of a flat bed without using bedrails?: A Little Help needed moving to and from a bed to a chair (including a wheelchair)?: A Little Help needed standing up from a chair using your arms (e.g., wheelchair or bedside chair)?: A Little Help needed to walk in hospital room?: A Little Help needed climbing 3-5 steps with a railing? : A Little 6 Click Score: 19    End of Session   Activity Tolerance: Patient tolerated treatment well Patient left: in chair;with call bell/phone within reach Nurse Communication: Mobility status PT Visit Diagnosis: Difficulty in walking, not elsewhere classified (R26.2)     Time: 0177-9390 PT Time Calculation (min) (ACUTE ONLY): 17 min  Charges:  $Gait Training: 8-22 mins                   Ivar Drape 05/28/2021, 12:53 PM  Samul Dada, PT MS Acute Rehab Dept. Number: [facility] [id number] and [facility] [phone]

## 2021-05-29 ENCOUNTER — Inpatient Hospital Stay (HOSPITAL_COMMUNITY): Payer: 59

## 2021-05-29 MED ORDER — IOHEXOL 9 MG/ML PO SOLN
ORAL | Status: AC
  Administered 2021-05-29: 500 mL
  Filled 2021-05-29: qty 1000

## 2021-05-29 NOTE — Progress Notes (Signed)
[facility] Gastroenterology Progress Note  Cassandra Peters 40 y.o. [birth year]  CC: Abdominal pain   Subjective: Patient seen and examined at bedside.   Complains of right upper AB pain at drain site with walking only. Patient is walking in the hall way without SOB or chest pain.  Patient states she is having some AB bloating after eating, denies AB pain with food, nausea, vomiting,tolerating full diet well otherwise.  Denies fever, chills.    ROS : Afebrile, negative for chest pain  Objective: Vital signs in last 24 hours: Vitals:   05/29/21 0425 05/29/21 0902  BP: 103/65 116/79  Pulse: 97 94  Resp:  17  Temp: 97.8 F (36.6 C) 98.1 F (36.7 C)  SpO2: 100% 99%    Physical Exam:  General : Resting comfortably in chair, not in acute distress Abdomen : Soft, nontender, no peritoneal signs.  Bowel sounds present.  Drains in place. Neuro : A/O X 3  Psych : Affect normal   Lab Results: Recent Labs    05/27/21 0308  NA 140  K 3.5  CL 106  CO2 24  GLUCOSE 91  BUN <5*  CREATININE 0.55  CALCIUM 8.5*   Recent Labs    05/27/21 0308  AST 21  ALT 21  ALKPHOS 52  BILITOT 0.6  PROT 5.2*  ALBUMIN 2.4*   Recent Labs    05/27/21 0308  WBC 6.3  HGB 10.2*  HCT 30.3*  MCV 91.5  PLT 221   No results for input(s): LABPROT, INR in the last 72 hours.    Assessment/Plan: -Abdominal pain in a patient with multiple recent procedures.  S/p lap chole which was complicated by bile leak.  She underwent ERCP on September 13 which was technically difficult.  Sphincterotomy was performed and plastic stent was placed in the CBD.  Follow-up CT scan on the same day showed free air in the retroperitoneum as well as malpositioned CBD stent.  She underwent laparoscopic repair of bile leak , abdominal washout and intraperitoneal drain placement on May 22, 2021.  EGD for biliary stent removal on May 23, 2021.  Gastrografin upper GI series as well as CT abdomen without contrast on  05/25/2021:  showed no extraluminal contrast to indicate leak.  Recommendations --------------------------- -Continue supportive care, continue early mobilization. -Patient's abdominal pain has improved significantly but still pain at right upper at drain exit -Regular diet at this time, tolerating well.  -[facility] will sign off. Please contact us if we can be of any further assistance during this hospital stay.   Doree Albee PA-C 05/29/2021, 11:42 AM

## 2021-05-29 NOTE — Progress Notes (Signed)
Physical Therapy Treatment and discharge  Patient Details Name: Cassandra Peters MRN: [id number] DOB: 08-30-1981 Today's Date: 05/29/2021   History of Present Illness Pt is a 40yo female presenting to [facility] ED on 9/11 with severe abdominal pain, nausea, and vomiting. She is recently s/p laparoscopic cholecystectomy on 9/6. Endoscopic retrograde cholangiopancreatography was completed 9/13 but the stent was misaligned so pt underwent a laparoscopic repair of bile leak and abdominal washout that same day. PMH: COVID 03/2021.    PT Comments    At arrival pt up ambulating in room independently.  She demonstrated hallway ambulation without difficulty and was safe at near normal gait speeds.  Pt managing IV pole and JP drains on her own.  Did not perform stairs but demonstrates mobility necessary to perform without difficulties based on clinical judgement.  Educated on gradual increase in walking to return to baseline once home.    Recommendations for follow up therapy are one component of a multi-disciplinary discharge planning process, led by the attending physician.  Recommendations may be updated based on patient status, additional functional criteria and insurance authorization.  Follow Up Recommendations  No PT follow up     Equipment Recommendations  None recommended by PT    Recommendations for Other Services       Precautions / Restrictions Precautions Precautions: Other (comment) Precaution Comments: 2 JP drains     Mobility  Bed Mobility Overal bed mobility: Independent             General bed mobility comments: At arrival pt up walking in room    Transfers Overall transfer level: Independent Equipment used: None Transfers: Sit to/from Stand Sit to Stand: Independent         General transfer comment: Pt up at arrival  Ambulation/Gait Ambulation/Gait assistance: Independent Gait Distance (Feet): 400 Feet Assistive device: None Gait Pattern/deviations: Step-through  pattern     General Gait Details: Had supervision but performing independently and safely at near normal speed.  In room, pt managing IV pole and JP drains.  In hall , PT pushed IV pole   Stairs             Wheelchair Mobility    Modified Rankin (Stroke Patients Only)       Balance Overall balance assessment: Independent   Sitting balance-Leahy Scale: Normal       Standing balance-Leahy Scale: Normal                              Cognition Arousal/Alertness: Awake/alert Behavior During Therapy: WFL for tasks assessed/performed Overall Cognitive Status: Within Functional Limits for tasks assessed                                        Exercises      General Comments        Pertinent Vitals/Pain Pain Assessment: 0-10 Pain Score: 2  Pain Location: abd with iniation of mobility Pain Descriptors / Indicators: Sore Pain Intervention(s): Limited activity within patient's tolerance    Home Living                      Prior Function            PT Goals (current goals can now be found in the care plan section) Acute Rehab PT Goals PT Goal Formulation: All assessment and  education complete, DC therapy Progress towards PT goals: Goals met/education completed, patient discharged from PT    Frequency           PT Plan Current plan remains appropriate    Co-evaluation              AM-PAC PT "6 Clicks" Mobility   Outcome Measure  Help needed turning from your back to your side while in a flat bed without using bedrails?: None Help needed moving from lying on your back to sitting on the side of a flat bed without using bedrails?: None Help needed moving to and from a bed to a chair (including a wheelchair)?: None Help needed standing up from a chair using your arms (e.g., wheelchair or bedside chair)?: None Help needed to walk in hospital room?: None Help needed climbing 3-5 steps with a railing? : None 6  Click Score: 24    End of Session   Activity Tolerance: Patient tolerated treatment well Patient left: in bed;with call bell/phone within reach Nurse Communication: Mobility status       Time: 1210-1219 PT Time Calculation (min) (ACUTE ONLY): 9 min  Charges:  $Gait Training: 8-22 mins                     Abran Richard, PT Acute Rehab Services Pager [phone] [facility] [place] 05/29/2021, 12:24 PM

## 2021-05-29 NOTE — Progress Notes (Signed)
Reviewed CT scan showing no undrained collections. Spoke with husband Karolee Ohs about findings. We will plan for morning discharge

## 2021-05-29 NOTE — Progress Notes (Signed)
6 Days Post-Op   Chief Complaint/Subjective: Worse pain around drains, eating well  Review of Systems See above, otherwise other systems negative   PMH -  has a past medical history of COVID-19 (03/2021), Dyspnea, GERD (gastroesophageal reflux disease), Gestational diabetes, and Pre-diabetes. PSH -  has a past surgical history that includes No past surgeries; Cholecystectomy (N/A, 05/15/2021); Cholecystectomy (N/A, 05/22/2021); Intraoperative cholangiogram (N/A, 05/22/2021); laparotomy (N/A, 05/23/2021); Endoscopic retrograde cholangiopancreatography (ercp) with propofol (N/A, 05/22/2021); pancreatic stent placement (05/22/2021); sphincterotomy (05/22/2021); and biliary stent placement (N/A, 05/22/2021).  [facility] - family history includes Diabetes in her mother; Hypertension in her mother.   Objective: Vital signs in last 24 hours: Temp:  [97.8 F (36.6 C)-98.5 F (36.9 C)] 98.1 F (36.7 C) (09/20 0902) Pulse Rate:  [85-97] 94 (09/20 0902) Resp:  [17-19] 17 (09/20 0902) BP: (103-116)/(65-79) 116/79 (09/20 0902) SpO2:  [99 %-100 %] 99 % (09/20 0902) Last BM Date: 05/27/21 Intake/Output from previous day: 09/19 0701 - 09/20 0700 In: 740.5 [P.O.:630; IV Piggyback:110.5] Out: 165 [Drains:165] Intake/Output this shift: Total I/O In: 240 [P.O.:240] Out: -   PE: Gen: NAD Resp: nonlabored Card: RRR Abd: soft, drains with bilious output, pain directly around drain positions  Lab Results:  Recent Labs    05/27/21 0308  WBC 6.3  HGB 10.2*  HCT 30.3*  PLT 221   BMET Recent Labs    05/27/21 0308  NA 140  K 3.5  CL 106  CO2 24  GLUCOSE 91  BUN <5*  CREATININE 0.55  CALCIUM 8.5*   PT/INR No results for input(s): LABPROT, INR in the last 72 hours. CMP     Component Value Date/Time   NA 140 05/27/2021 0308   NA 145 (H) 08/13/2016 1049   K 3.5 05/27/2021 0308   CL 106 05/27/2021 0308   CO2 24 05/27/2021 0308   GLUCOSE 91 05/27/2021 0308   BUN <5 (L) 05/27/2021 0308   BUN  12 08/13/2016 1049   CREATININE 0.55 05/27/2021 0308   CREATININE 0.59 05/14/2016 1227   CALCIUM 8.5 (L) 05/27/2021 0308   PROT 5.2 (L) 05/27/2021 0308   PROT 6.8 08/13/2016 1049   ALBUMIN 2.4 (L) 05/27/2021 0308   ALBUMIN 4.5 08/13/2016 1049   AST 21 05/27/2021 0308   ALT 21 05/27/2021 0308   ALKPHOS 52 05/27/2021 0308   BILITOT 0.6 05/27/2021 0308   BILITOT <0.2 08/13/2016 1049   GFRNONAA >60 05/27/2021 0308   GFRNONAA >89 05/14/2016 1227   GFRAA 141 08/13/2016 1049   GFRAA >89 05/14/2016 1227   Lipase     Component Value Date/Time   LIPASE 137 (H) 05/25/2021 0019    Studies/Results: No results found.  Anti-infectives: Anti-infectives (From admission, onward)    Start     Dose/Rate Route Frequency Ordered Stop   05/22/21 1500  piperacillin-tazobactam (ZOSYN) IVPB 3.375 g        3.375 g 12.5 mL/hr over 240 Minutes Intravenous Every 8 hours 05/22/21 1410     05/20/21 0300  piperacillin-tazobactam (ZOSYN) IVPB 3.375 g        3.375 g 100 mL/hr over 30 Minutes Intravenous  Once 05/20/21 0248 05/20/21 0326       Assessment/Plan 40 yo female s/p lap chole with post op leak, lap drain placement and CBD repair, ERCP and then EGD for stent removal. Drains with bilious output. Given that she her biliary tree was instrumented with complication, she has a sphincterotomy so we will maintain drainage for 6-8 weeks to allow  healing -ct scan today due to worsening pain in case failure of drainags of leak    LOS: 8 days   [facility] 05/29/2021, 1:18 PM Please see Amion for pager number during day hours 7:00am-4:30pm or 7:00am -11:30am on weekends

## 2021-05-30 MED ORDER — ACETAMINOPHEN 500 MG PO TABS: 500.0000 mg | ORAL_TABLET | Freq: Four times a day (QID) | ORAL | 0 refills | Status: AC | PRN

## 2021-05-30 MED ORDER — OXYCODONE HCL 5 MG PO TABS: 5.0000 mg | ORAL_TABLET | Freq: Four times a day (QID) | ORAL | 0 refills | Status: AC | PRN

## 2021-05-30 NOTE — Progress Notes (Signed)
Patient discharged to home with instructions given, educated on how to manage her JP drains, answered questions and provided supplies.

## 2021-06-08 NOTE — Op Note (Signed)
I was present and available while Dr. Marca Ancona removed the stent endoscopically.

## 2021-06-19 ENCOUNTER — Other Ambulatory Visit: Payer: Self-pay | Admitting: General Surgery

## 2021-06-19 DIAGNOSIS — S3613XS Injury of bile duct, sequela: Secondary | ICD-10-CM

## 2021-06-19 DIAGNOSIS — K819 Cholecystitis, unspecified: Secondary | ICD-10-CM

## 2021-06-27 ENCOUNTER — Other Ambulatory Visit (HOSPITAL_COMMUNITY): Payer: Self-pay | Admitting: General Surgery

## 2021-06-27 DIAGNOSIS — Z9049 Acquired absence of other specified parts of digestive tract: Secondary | ICD-10-CM

## 2021-06-28 ENCOUNTER — Other Ambulatory Visit: Payer: Self-pay

## 2021-06-28 ENCOUNTER — Ambulatory Visit (HOSPITAL_COMMUNITY)
Admission: RE | Admit: 2021-06-28 | Discharge: 2021-06-28 | Disposition: A | Discharge status: Home / Self Care | Payer: 59 | Source: Ambulatory Visit | Attending: General Surgery | Admitting: General Surgery

## 2021-06-28 DIAGNOSIS — Z9049 Acquired absence of other specified parts of digestive tract: Secondary | ICD-10-CM | POA: Insufficient documentation

## 2021-06-28 MED ORDER — IOHEXOL 350 MG/ML SOLN
100.0000 mL | Freq: Once | INTRAVENOUS | Status: AC | PRN
  Administered 2021-06-28: 80 mL via INTRAVENOUS

## 2021-06-28 MED ORDER — IOHEXOL 9 MG/ML PO SOLN
ORAL | Status: AC
  Administered 2021-06-28: 1000 mL via ORAL
  Filled 2021-06-28: qty 1000

## 2021-06-28 MED ORDER — IOHEXOL 9 MG/ML PO SOLN: 1000.0000 mL | ORAL | Status: AC

## 2021-06-29 ENCOUNTER — Other Ambulatory Visit: Payer: Self-pay | Admitting: General Surgery

## 2021-06-29 NOTE — Progress Notes (Unsigned)
Spoke with husband Cassandra Peters about CT scan results and current drainage. She continues to have some pain around the tubes. Her CT scan shows no new fluid collections or abnormality of the liver/bile duct areas. She does have some constipation on imaging. She will follow up with IR clinic next week

## 2021-07-02 ENCOUNTER — Ambulatory Visit (HOSPITAL_COMMUNITY): Payer: 59

## 2021-07-04 ENCOUNTER — Ambulatory Visit
Admission: RE | Admit: 2021-07-04 | Discharge: 2021-07-04 | Disposition: A | Discharge status: Home / Self Care | Payer: 59 | Source: Ambulatory Visit | Attending: General Surgery | Admitting: General Surgery

## 2021-07-04 DIAGNOSIS — S3613XS Injury of bile duct, sequela: Secondary | ICD-10-CM

## 2021-07-04 DIAGNOSIS — K819 Cholecystitis, unspecified: Secondary | ICD-10-CM

## 2021-07-04 HISTORY — PX: IR RADIOLOGIST EVAL & MGMT: IMG5224

## 2021-07-04 NOTE — Progress Notes (Signed)
Chief Complaint: Patient was seen in consultation today for question of bile duct fistula  at the request of Rodman Pickle  Referring Physician(s): Rodman Pickle  Supervising Physician: Irish Lack  History of Present Illness: Shoshannah Faubert is a 40 y.o. female with past medical history significant for ap chole with post op leak, lap drain placement with CBD repair, ERCP and eventual EGD for stent removal seen today for drain injection due to concern for fistulous connection to the bile duct. Patient does not have IR drains in place, however she does have two surgically placed 19 Fr Jackson-Pratt drains with closed bulb suction which we are asked to evaluate.  Patient seen with arabic interpreter, she reports ongoing abdominal pain at the drain sites - especially the lower drain which has been very painful the last few days. She has not had any fevers or chills. She reports the top drain continues to have drainage but the lower one has not had any for a few days.  Past Medical History:  Diagnosis Date   COVID-19 03/2021   Dyspnea    During Covid in 03/2021   GERD (gastroesophageal reflux disease)    Gestational diabetes    Pre-diabetes     Past Surgical History:  Procedure Laterality Date   BILIARY STENT PLACEMENT N/A 05/22/2021   Procedure: BILIARY STENT PLACEMENT;  Surgeon: Kerin Salen, MD;  Location: [facility];  Service: Gastroenterology;  Laterality: N/A;   CHOLECYSTECTOMY N/A 05/15/2021   Procedure: LAPAROSCOPIC CHOLECYSTECTOMY;  Surgeon: Sheliah Hatch De Blanch, MD;  Location: [facility];  Service: General;  Laterality: N/A;   CHOLECYSTECTOMY N/A 05/22/2021   Procedure: LAPAROSCOPIC REPAIR OF BILE LEAK AND ABDOMINAL WASHOUT;  Surgeon: Quentin Ore, MD;  Location: [facility];  Service: General;  Laterality: N/A;   ENDOSCOPIC RETROGRADE CHOLANGIOPANCREATOGRAPHY (ERCP) WITH PROPOFOL N/A 05/22/2021   Procedure: ENDOSCOPIC RETROGRADE CHOLANGIOPANCREATOGRAPHY  (ERCP) WITH PROPOFOL;  Surgeon: Kerin Salen, MD;  Location: [facility];  Service: Gastroenterology;  Laterality: N/A;   INTRAOPERATIVE CHOLANGIOGRAM N/A 05/22/2021   Procedure: INTRAOPERATIVE CHOLANGIOGRAM;  Surgeon: Quentin Ore, MD;  Location: [facility];  Service: General;  Laterality: N/A;   IR RADIOLOGIST EVAL & MGMT  07/04/2021   LAPAROTOMY N/A 05/23/2021   Procedure: INTRAOPERATIVE ENDOSCOPY;  Surgeon: Sheliah Hatch De Blanch, MD;  Location: [facility] OR;  Service: General;  Laterality: N/A;   NO PAST SURGERIES     PANCREATIC STENT PLACEMENT  05/22/2021   Procedure: PANCREATIC STENT PLACEMENT;  Surgeon: Kerin Salen, MD;  Location: [facility];  Service: Gastroenterology;;   Dennison Mascot  05/22/2021   Procedure: Dennison Mascot;  Surgeon: Kerin Salen, MD;  Location: [facility];  Service: Gastroenterology;;    Allergies: Patient has no known allergies.  Medications: Prior to Admission medications   Medication Sig Start Date End Date Taking? Authorizing Provider  acetaminophen (TYLENOL) 500 MG tablet Take 1 tablet (500 mg total) by mouth every 6 (six) hours as needed for up to 30 doses. 05/30/21   Kinsinger, De Blanch, MD  cholecalciferol (VITAMIN D3) 25 MCG (1000 UNIT) tablet Take 1,000 Units by mouth daily.    [provider]  ibuprofen (ADVIL) 800 MG tablet Take 1 tablet (800 mg total) by mouth every 8 (eight) hours as needed. 05/15/21   Kinsinger, De Blanch, MD  oxyCODONE (OXY IR/ROXICODONE) 5 MG immediate release tablet Take 1 tablet (5 mg total) by mouth every 6 (six) hours as needed for severe pain. 05/30/21   Kinsinger, De Blanch, MD  vitamin B-12 (CYANOCOBALAMIN) 1000 MCG  tablet Take 1,000 mcg by mouth daily.    [provider]     Family History  Problem Relation Age of Onset   Diabetes Mother    Hypertension Mother     Social History   Socioeconomic History   Marital status: Married    Spouse name: Not on file   Number of children: Not on file    Years of education: Not on file   Highest education level: Not on file  Occupational History   Not on file  Tobacco Use   Smoking status: Never   Smokeless tobacco: Never  Vaping Use   Vaping Use: Never used  Substance and Sexual Activity   Alcohol use: No   Drug use: No   Sexual activity: Yes    Partners: Male    Birth control/protection: None  Other Topics Concern   Not on file  Social History Narrative   Married   From [place] - arrived in [place] 04/06/2016   Social Determinants of Health   Financial Resource Strain: Not on file  Food Insecurity: Not on file  Transportation Needs: Not on file  Physical Activity: Not on file  Stress: Not on file  Social Connections: Not on file    Review of Systems: A 12 point ROS discussed and pertinent positives are indicated in the HPI above.  All other systems are negative.  Review of Systems  Constitutional:  Negative for chills and fever.  Respiratory:  Negative for shortness of breath.   Cardiovascular:  Negative for chest pain.  Gastrointestinal:  Positive for abdominal pain (at drains). Negative for nausea and vomiting.   Vital Signs: LMP 06/21/2021   Physical Exam Constitutional:      General: She is not in acute distress. Pulmonary:     Effort: Pulmonary effort is normal.  Abdominal:     General: There is no distension.     Palpations: Abdomen is soft.     Tenderness: There is abdominal tenderness (at drain sites).     Comments: (+) RUQ surgical drain to JP with thick tan bloody output in bulb and tubing. Tubing flushed with 5 cc NS prior to contrast injection without difficulty. Attempted to inject 3 cc of contrast through the drain however no contrast enters the abdominal cavity and immediately exist through the insertion site  (+) inferior right lateral drain is pulled back and almost completely dislodged, as such this drain was not injected.  Skin:    General: Skin is warm and dry.  Neurological:     Mental Status:  She is alert.    Imaging: CT ABDOMEN PELVIS W CONTRAST  Result Date: 07/04/2021 CLINICAL DATA:  Patient complains of right side abdomen pain. Status post cholecystectomy on 09/06. Patient reports she has two drains in place from 09/13. EXAM: CT ABDOMEN AND PELVIS WITH CONTRAST TECHNIQUE: Multidetector CT imaging of the abdomen and pelvis was performed using the standard protocol following bolus administration of intravenous contrast. CONTRAST:  10mL OMNIPAQUE IOHEXOL 350 MG/ML SOLN COMPARISON:  CT abdomen and pelvis 05/29/2021; X-ray abdomen 05/23/2021 FINDINGS: Lower chest: No acute abnormality. Hepatobiliary: No focal liver abnormality identified. There are 2 surgical drains in place within the gallbladder fossa and along the medial and inferior margin of right hepatic lobe. There is a small volume of partially loculated fluid identified within the gallbladder fossa extending along the inferior margin of right hepatic lobe and into the right pericolic gutter. This appears increased from CT dated 05/29/2021. Loculated low-density fluid along  the medial margin of right hepatic lobe in the gallbladder fossa measures 2.6 x 1.3 x 3.2 cm, image 27/2 and image 44/5. The common bile duct is mildly increased in caliber measuring 9 mm. No intrahepatic bile duct dilatation. Pancreas: Unremarkable. No pancreatic ductal dilatation or surrounding inflammatory changes. Spleen: Normal in size without focal abnormality. Adrenals/Urinary Tract: Normal adrenal glands. No kidney mass or hydronephrosis identified bilaterally. Stomach/Bowel: Stomach appears within normal limits. The appendix is visualized and appears normal. No bowel wall thickening, inflammation, or distension. Vascular/Lymphatic: Normal appearance of the abdominal aorta. No abdominopelvic adenopathy. Reproductive: Uterus and bilateral adnexa are unremarkable. Other: None Musculoskeletal: No acute or significant osseous findings. IMPRESSION: 1. Status post  cholecystectomy. Two surgical drains are in place within the gallbladder fossa and along the medial and inferior margin of right hepatic lobe. There is a small volume of partially loculated fluid identified within the gallbladder fossa extending along the inferior margin of right hepatic lobe and into the right pericolic gutter. This appears increased from CT dated 05/29/2021, and is concerning for small biloma. Electronically Signed   By: Signa Kell M.D.   On: 07/04/2021 10:18   IR Radiologist Eval & Mgmt  Result Date: 07/04/2021 Please refer to notes tab for details about interventional procedure. (Op Note)   Labs:  CBC: Recent Labs    05/24/21 0232 05/25/21 0019 05/26/21 0202 05/27/21 0308  WBC 7.4 7.7 7.8 6.3  HGB 10.4* 10.1* 10.2* 10.2*  HCT 30.6* 30.0* 30.4* 30.3*  PLT 201 179 190 221    COAGS: Recent Labs    05/20/21 0246  INR 1.0  APTT 27    BMP: Recent Labs    05/24/21 0232 05/25/21 0019 05/26/21 0202 05/27/21 0308  NA 142 138 138 140  K 3.3* 3.7 3.5 3.5  CL 114* 108 105 106  CO2 23 22 22 24   GLUCOSE 121* 104* 82 91  BUN 8 <5* <5* <5*  CALCIUM 7.8* 8.1* 8.5* 8.5*  CREATININE 0.56 0.41* 0.52 0.55  GFRNONAA >60 >60 >60 >60    LIVER FUNCTION TESTS: Recent Labs    05/24/21 0232 05/25/21 0019 05/26/21 0202 05/27/21 0308  BILITOT 1.2 1.1 1.1 0.6  AST 21 18 17 21   ALT 22 20 18 21   ALKPHOS 49 46 50 52  PROT 4.8* 5.0* 5.1* 5.2*  ALBUMIN 2.3* 2.4* 2.4* 2.4*    TUMOR MARKERS: No results for input(s): AFPTM, CEA, CA199, CHROMGRNA in the last 8760 hours.  Assessment:  40 y/o F with history of lap chole with post op leak, lap drain placement with CBD repair, ERCP and eventual EGD for stent removal seen today for drain injection due to concern for fistulous connection to the bile duct.  Per chart review these are surgically placed 19 Fr Jackson-Pratt drains with closed bulb suction placed 05/22/21 by Dr. . Per OP note the medial drain  placed in the right pericolic gutter tracking down towards the pelvis and lateral drain placed in the gallbladder fossa.  On exam the more superior, medial drain is in tact with retention suture in skin. There is thick, tan bloody output in the suction bulb. Attempted to injection this drain however contrast immediately exits the insertion site after 2 attempts.   The lateral drain is unfortunately almost completely dislodged from the skin and the retention suture is broken. As such this drain was unable to be injected. Per patient this drain has looked this way for a few days and has been very painful.  Due to above we were unable to assess for fistulous connection to the bile ducts - the patient was told to call her surgeon today to discuss the dislodged drain.  No further IR needs at this time.  Electronically Signed: Villa Herb PA-C 07/04/2021, 2:17 PM   Please refer to Dr. Antonietta Jewel attestation of this note for management and plan.

## 2021-07-12 NOTE — Op Note (Signed)
[facility] Patient Name: Cassandra Peters Procedure Date : 05/23/2021 MRN: [id number] Attending MD: Kerin Salen , MD Date of Birth: [birth year] CSN: [id number] Age: 40 Admit Type: Inpatient Procedure:                Upper GI endoscopy Indications:              Biliary stent removal Providers:                Kerin Salen, MD, Estella Husk, RN Referring MD:             Dr.Luke Kinsinger Medicines:                Monitored Anesthesia Care Complications:            No immediate complications. Estimated Blood Loss:     Estimated blood loss: none. Procedure:                Pre-Anesthesia Assessment:                           - Prior to the procedure, a History and Physical                            was performed, and patient medications and                            allergies were reviewed. The patient's tolerance of                            previous anesthesia was also reviewed. The risks                            and benefits of the procedure and the sedation                            options and risks were discussed with the patient.                            All questions were answered, and informed consent                            was obtained. Prior Anticoagulants: The patient has                            taken no previous anticoagulant or antiplatelet                            agents. ASA Grade Assessment: II - A patient with                            mild systemic disease. After reviewing the risks                            and benefits, the patient was deemed in  satisfactory condition to undergo the procedure.                           After obtaining informed consent, the endoscope was                            passed under direct vision. Throughout the                            procedure, the patient's blood pressure, pulse, and                            oxygen saturations were monitored continuously. The                             [id number]-[id number]) Olympus gastroscope was                            introduced through the mouth, and advanced to the                            second part of duodenum. The upper GI endoscopy was                            accomplished without difficulty. The patient                            tolerated the procedure well. Scope In: 3:14:06 PM Scope Out: 3:18:29 PM Total Procedure Duration: 0 hours 4 minutes 23 seconds  Findings:      The examined esophagus was normal.      The entire examined stomach was normal.      The duodenal bulb and first portion of the duodenum were normal.      A previously placed plastic stent was seen in the ampulla. Stent removal       was accomplished with a snare.      There was no associated bleeding noted after stent removal.      There was no change in patient's hemodynamics, she was not hypotensive       or tachycardic. Impression:               - Normal esophagus.                           - Normal stomach.                           - Normal duodenal bulb and first portion of the                            duodenum.                           - Plastic stent in the duodenum. Removed. Moderate Sedation:      Patient did not receive moderate sedation for this procedure, but       instead received monitored anesthesia care. Recommendation:           -  NPO.                           - Continue aggressive IVF hydration, NG tube for                            decompression. Procedure Code(s):        --- Professional ---                           [phone], Esophagogastroduodenoscopy, flexible,                            transoral; with removal of foreign body(s) Diagnosis Code(s):        --- Professional ---                           Z46.59, Encounter for fitting and adjustment of                            other gastrointestinal appliance and device CPT copyright 2019 [employer]. All rights reserved. The codes documented in this  report are preliminary and upon coder review may  be revised to meet current compliance requirements. Ronnette Juniper, MD 05/23/2021 3:46:36 PM This report has been signed electronically. Number of Addenda: 0

## 2021-07-12 NOTE — Discharge Summary (Signed)
Physician Discharge Summary  Patient ID: Cassandra Peters MRN: [id number] DOB/AGE: June 16, 1981 40 y.o.  Admit date: 05/20/2021 Discharge date: 07/12/2021  Admission Diagnoses:  Discharge Diagnoses:  Active Problems:   Postoperative abdominal pain   Complication, surgical   Discharged Condition: fair  Hospital Course: 40 yo female presented after lap cholecystectomy. SHe had a CT scan showing fluid in the gallbladder fossa and HIDA scan confirming leak. She underwent ERCP with stent placement however on follow up imaging it was found that the stent went through the duodenum or pancreas to sit near the IVC and aorta. She was taken back to the operating room for drainage and primary repair of a common bile duct injury. The next day she was transferred to [facility] where she underwent EGD with stent removal. She was watched with antibiotics and slowly advanced diet and had return of bowel function. The drains decreased in output and she was discharged home.  Consults: GI  Significant Diagnostic Studies: labs: CBC    Component Value Date/Time   WBC 6.3 05/27/2021 0308   RBC 3.31 (L) 05/27/2021 0308   HGB 10.2 (L) 05/27/2021 0308   HGB 12.8 08/13/2016 1049   HCT 30.3 (L) 05/27/2021 0308   HCT 36.7 08/13/2016 1049   PLT 221 05/27/2021 0308   PLT 255 08/13/2016 1049   MCV 91.5 05/27/2021 0308   MCV 88 08/13/2016 1049   MCH 30.8 05/27/2021 0308   MCHC 33.7 05/27/2021 0308   RDW 13.0 05/27/2021 0308   RDW 13.8 08/13/2016 1049   LYMPHSABS 2.6 05/20/2021 0246   MONOABS 0.7 05/20/2021 0246   EOSABS 0.0 05/20/2021 0246   BASOSABS 0.0 05/20/2021 0246     Treatments: IV hydration, antibiotics: Zosyn, and procedures: ERCP with biliary stent placement, laparoscopic bile duct repair with drain placement  Discharge Exam: Blood pressure (!) 94/55, pulse 77, temperature 98.1 F (36.7 C), temperature source Oral, resp. rate 14, height 5\' 7"  (1.702 m), weight 65.8 kg, last menstrual period  05/21/2021, SpO2 98 %. General appearance: alert, cooperative, and appears stated age Head: Normocephalic, without obvious abnormality, atraumatic Resp: clear to auscultation bilaterally Cardio: regular rate and rhythm, S1, S2 normal, no murmur, click, rub or gallop GI: soft, non-tender; bowel sounds normal; no masses,  no organomegaly and drains with bilious output.  Disposition: Discharge disposition: 01-Home or Self Care       Discharge Instructions     Call MD for:  persistant nausea and vomiting   Complete by: As directed    Call MD for:  redness, tenderness, or signs of infection (pain, swelling, redness, odor or green/yellow discharge around incision site)   Complete by: As directed    Call MD for:  severe uncontrolled pain   Complete by: As directed    Call MD for:  temperature >100.4   Complete by: As directed    Diet - low sodium heart healthy   Complete by: As directed    Discharge wound care:   Complete by: As directed    Maintain drains and keep output volume records daily. Keep bags over skin for protection, if bags become an issue call office for nurse visit. Ok to shower   Increase activity slowly   Complete by: As directed       Allergies as of 05/30/2021   No Known Allergies      Medication List     TAKE these medications    acetaminophen 500 MG tablet Commonly known as: TYLENOL Take 1 tablet (  500 mg total) by mouth every 6 (six) hours as needed for up to 30 doses.   cholecalciferol 25 MCG (1000 UNIT) tablet Commonly known as: VITAMIN D3 Take 1,000 Units by mouth daily.   ibuprofen 800 MG tablet Commonly known as: ADVIL Take 1 tablet (800 mg total) by mouth every 8 (eight) hours as needed.   oxyCODONE 5 MG immediate release tablet Commonly known as: Oxy IR/ROXICODONE Take 1 tablet (5 mg total) by mouth every 6 (six) hours as needed for severe pain.   vitamin B-12 1000 MCG tablet Commonly known as: CYANOCOBALAMIN Take 1,000 mcg by mouth  daily.               Discharge Care Instructions  (From admission, onward)           Start     Ordered   05/30/21 0000  Discharge wound care:       Comments: Maintain drains and keep output volume records daily. Keep bags over skin for protection, if bags become an issue call office for nurse visit. Ok to shower   05/30/21 0825            Follow-up Information     Caela Huot, De Blanch, MD Follow up in 2 week(s).   Specialty: General Surgery Contact information: [address] [place] [address] [place] [address] [phone]                 Signed: De Blanch Eddrick Dilone 07/12/2021, 8:29 AM

## 2021-12-16 IMAGING — CT CT ABD-PELV W/O CM
2 of 4 series · 16 of 46 positions shown, 18 images · non-contrast
Comparison: CT abdomen and pelvis 05/25/2021. CT abdomen and pelvis
05/22/2021.

CLINICAL DATA: Abdominal pain, fever, postoperative. Duodenal
injury.

EXAM:
CT ABDOMEN AND PELVIS WITHOUT CONTRAST
TECHNIQUE: Multidetector CT imaging of the abdomen and pelvis was performed
following the standard protocol without IV contrast.

[Series 3: a/p w/o 5mm · axial · non-contrast · 0.83mm/px · z∈[-654,-229]mm · 13 of 93 slices shown, 15 images]
[im 4/93  soft-tissue]
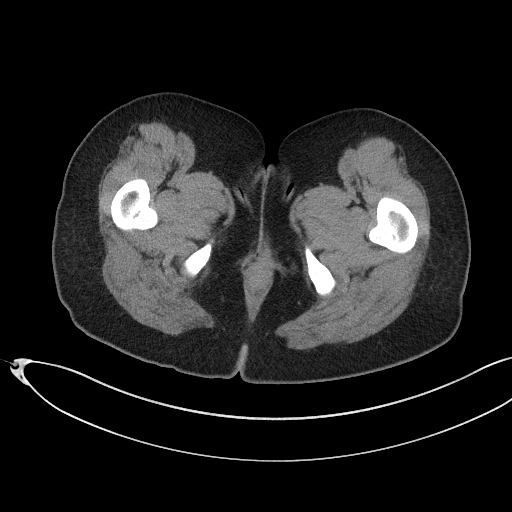
[im 4/93  bone]
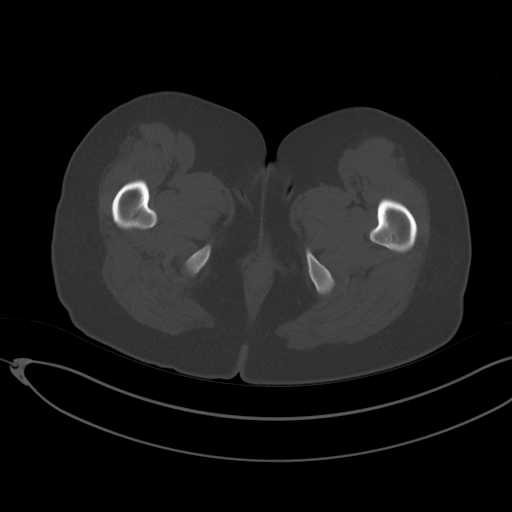
[im 12/93  soft-tissue]
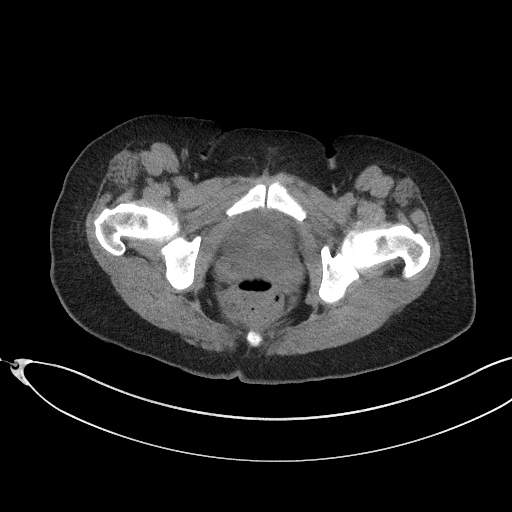
[im 20/93  soft-tissue]
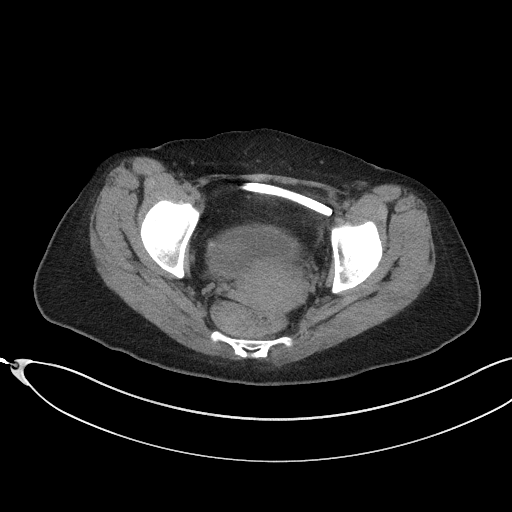
[im 27/93  soft-tissue]
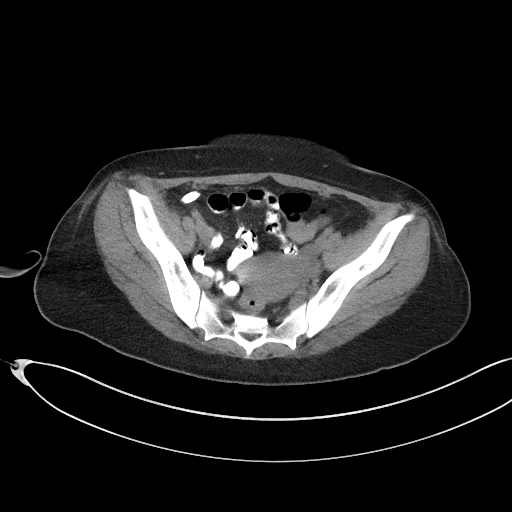
[im 31/93  soft-tissue]
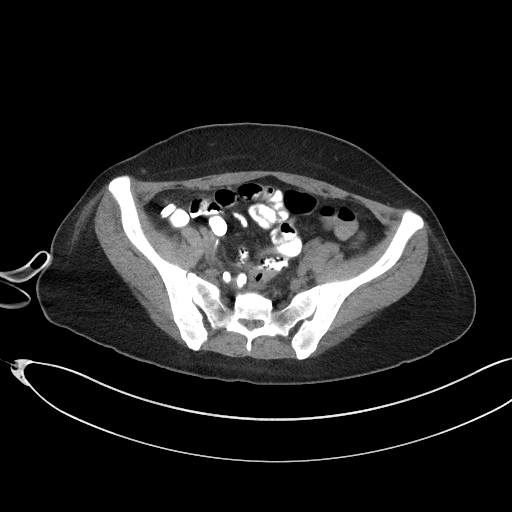
[im 39/93  soft-tissue]
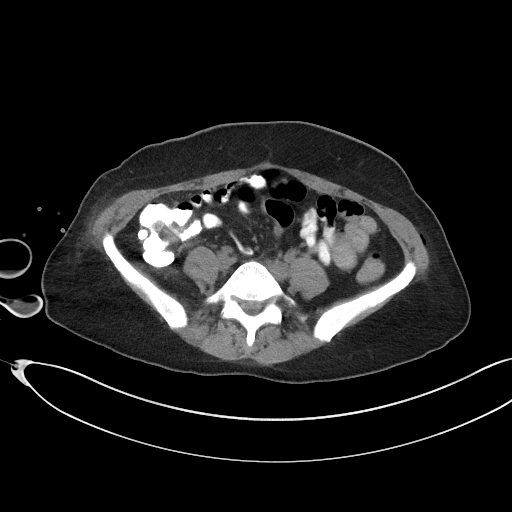
[im 47/93  soft-tissue]
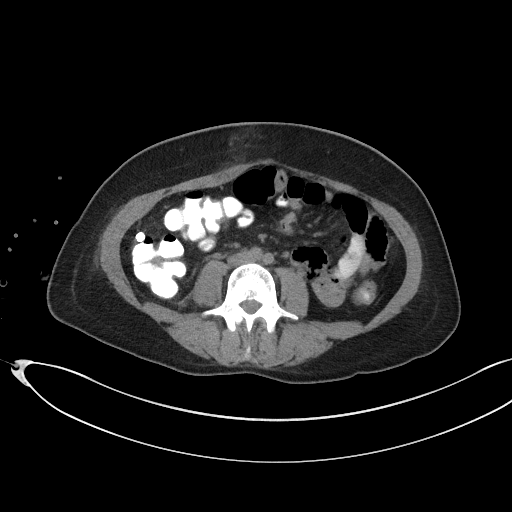
[im 54/93  soft-tissue]
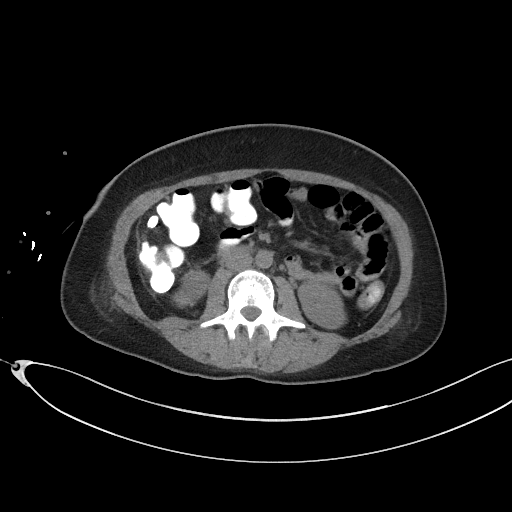
[im 62/93  soft-tissue]
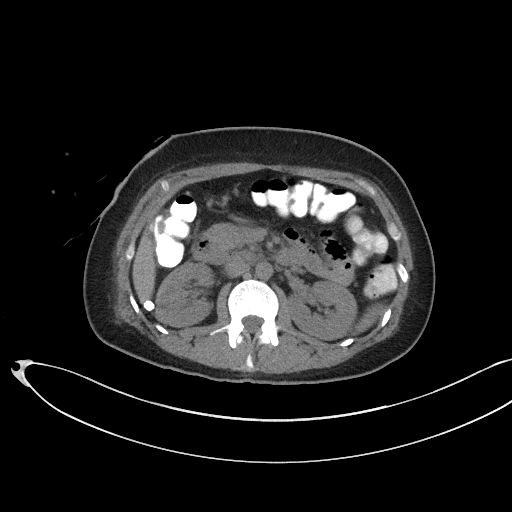
[im 62/93  bone]
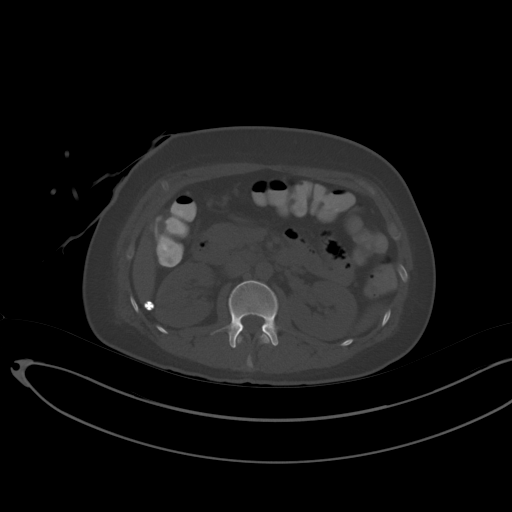
[im 66/93  soft-tissue]
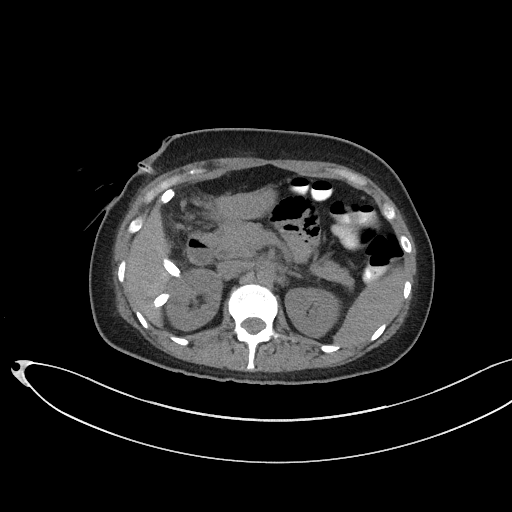
[im 73/93  soft-tissue]
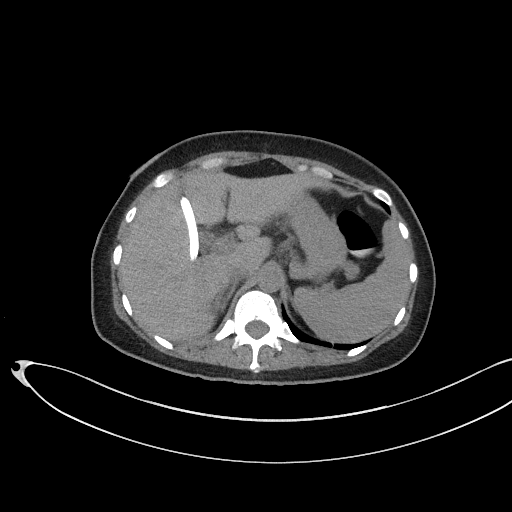
[im 81/93  soft-tissue]
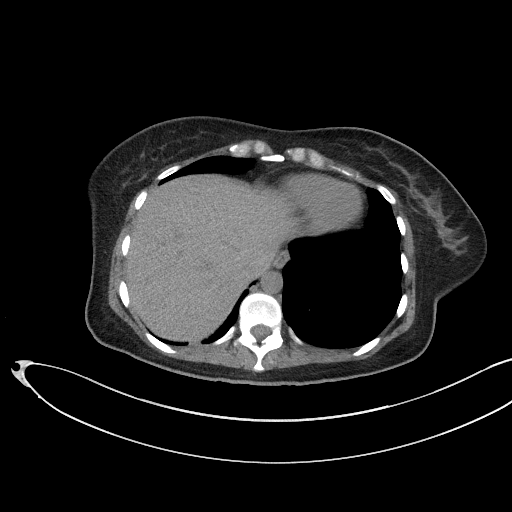
[im 89/93  soft-tissue]
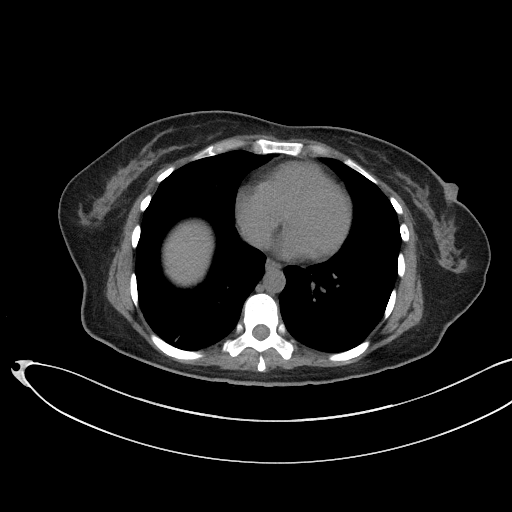

[Series 6: a/p w/o cor · coronal · non-contrast · 0.73mm/px · 3 of 109 slices shown]
[im 37/109  soft-tissue]
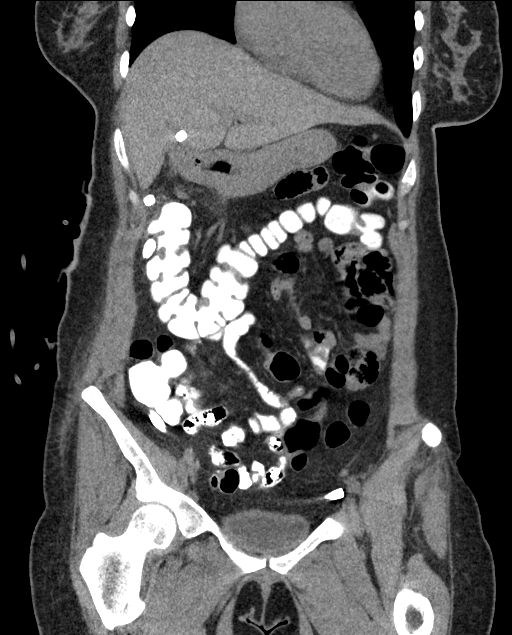
[im 49/109  soft-tissue]
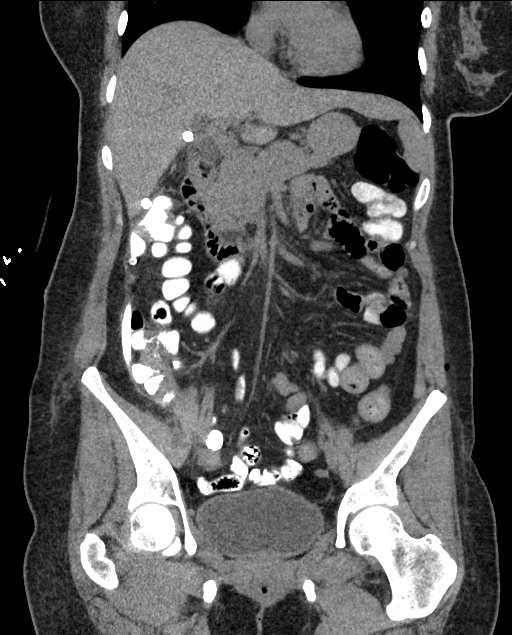
[im 61/109  soft-tissue]
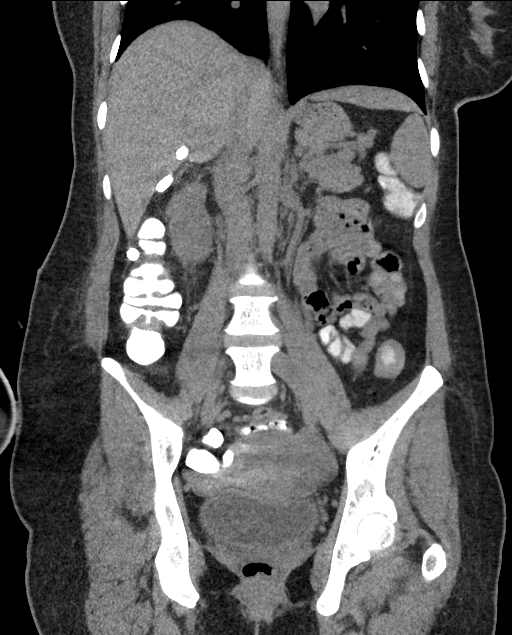

[16 of 46 positions shown; findings below may reference images not displayed]

FINDINGS: Lower chest: There is linear atelectasis in the lung bases.

Hepatobiliary: The gallbladder is surgically absent. There are no
fluid collections in the gallbladder fossa. There is no definite
biliary ductal dilatation. The liver appears within normal limits.

Pancreas: There is questionable mild inflammatory stranding
surrounding the head of the pancreas. Body and tail are within
normal limits.

Spleen: Normal in size without focal abnormality.

Adrenals/Urinary Tract: The adrenal glands and kidneys are within
normal limits. There is a small amount of air in the bladder. The
bladder is otherwise within normal limits.

Stomach/Bowel: There is no bowel obstruction. Oral contrast reaches
the descending colon. There is no extravasation of oral contrast.
The stomach is decompressed. The appendix is within normal limits.
There is mild inflammatory stranding surrounding the duodenum. There
is a tiny focus of free air near the duodenum. Air has significantly
decreased when compared to the prior examination. No definite fluid
collection.

Vascular/Lymphatic: No significant vascular findings are present. No
enlarged abdominal or pelvic lymph nodes.

Reproductive: Uterus and bilateral adnexa are unremarkable.

Other: There is no ascites. No focal fluid collection. There are 2
percutaneous drains in the right abdomen. One ends in the pelvis in
the other ends in the region of the gallbladder fossa. Catheters are
unchanged in position.

Musculoskeletal: No fracture is seen.
IMPRESSION: 1. Mild inflammation surrounding the duodenum and pancreatic head.
Findings may be postsurgical. Duodenitis/pancreatitis not excluded.
There is a tiny focus of extraluminal air adjacent to the duodenum.
Free air has significantly decreased when compared to the prior
study.
2. Right-sided drainage catheters are unchanged in position. No
focal fluid collections.

## 2022-01-15 IMAGING — CT CT ABD-PELV W/ CM
2 of 4 series · 15 of 46 positions shown, 17 images · IV contrast (APPLIED)
Comparison: CT abdomen and pelvis 05/29/2021; X-ray abdomen
05/23/2021

CLINICAL DATA: Patient complains of right side abdomen pain. Status
place from [DATE].

EXAM:
CT ABDOMEN AND PELVIS WITH CONTRAST
TECHNIQUE: Multidetector CT imaging of the abdomen and pelvis was performed
using the standard protocol following bolus administration of
intravenous contrast.
CONTRAST:  80mL OMNIPAQUE IOHEXOL 350 MG/ML SOLN

[Series 2: axial st · axial · 0.73mm/px · z∈[-363,+62]mm · 12 of 97 slices shown, 14 images]
[im 6/97  soft-tissue]
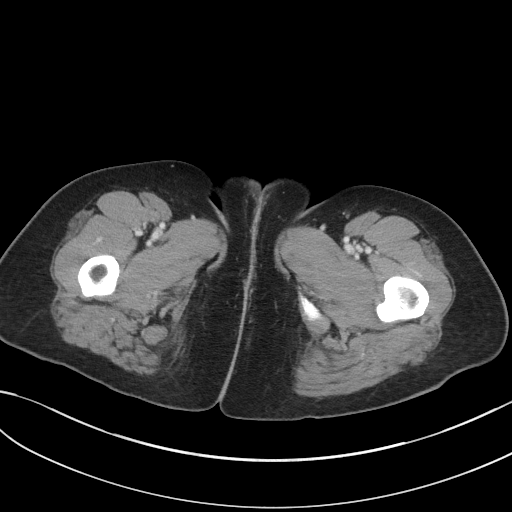
[im 6/97  bone]
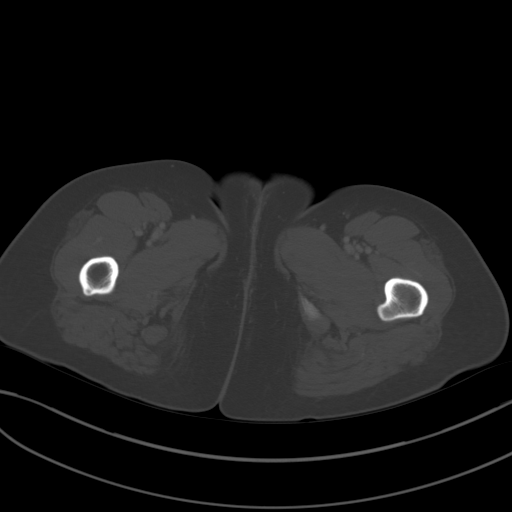
[im 17/97  soft-tissue]
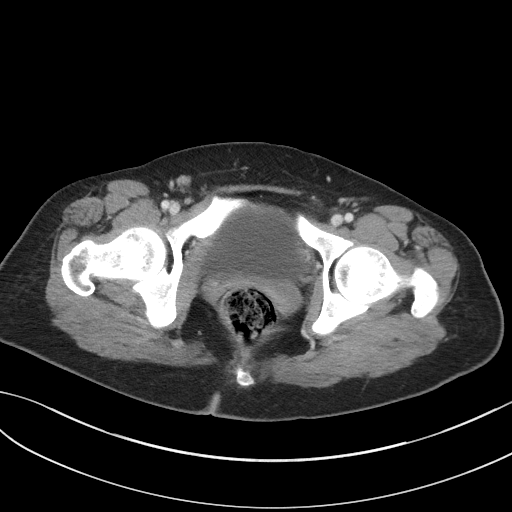
[im 23/97  soft-tissue]
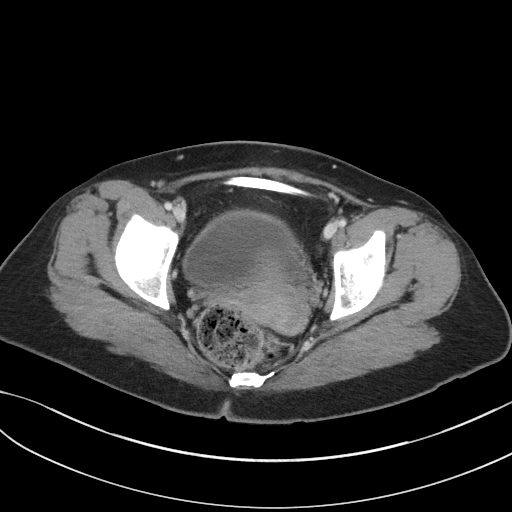
[im 29/97  soft-tissue]
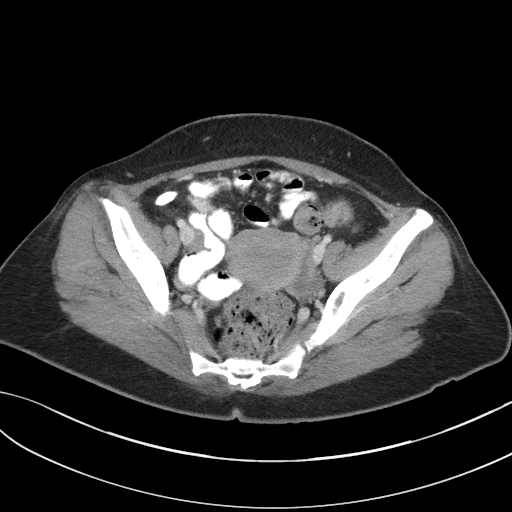
[im 40/97  soft-tissue]
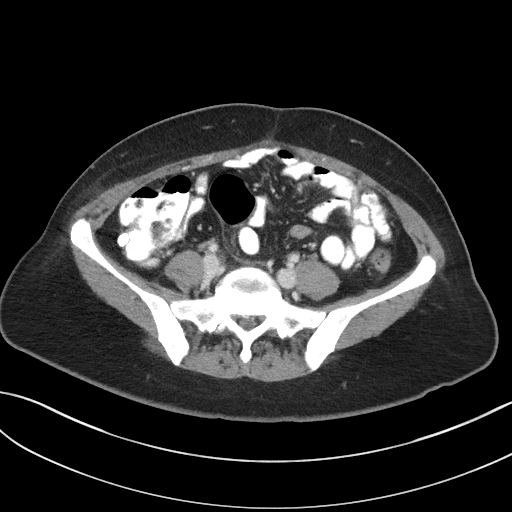
[im 46/97  soft-tissue]
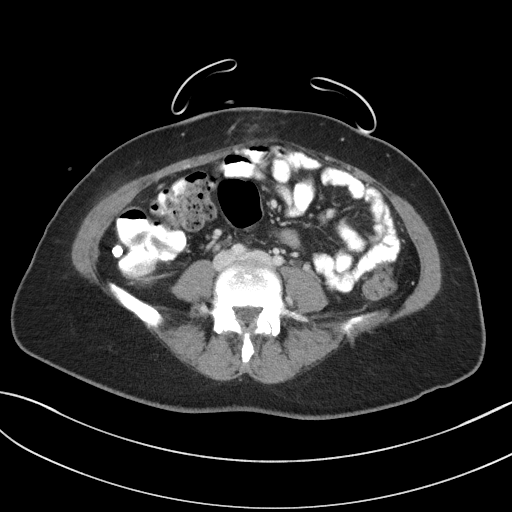
[im 51/97  soft-tissue]
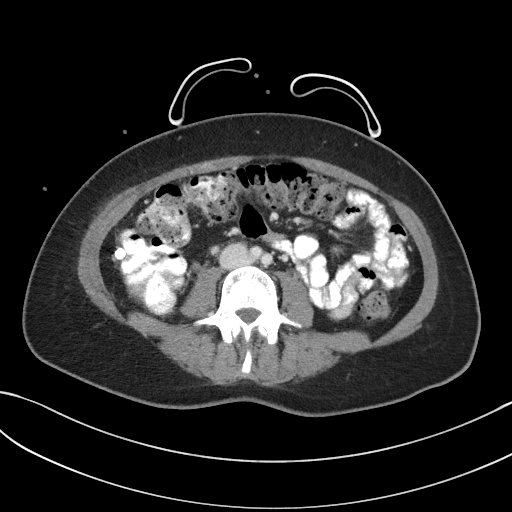
[im 63/97  soft-tissue]
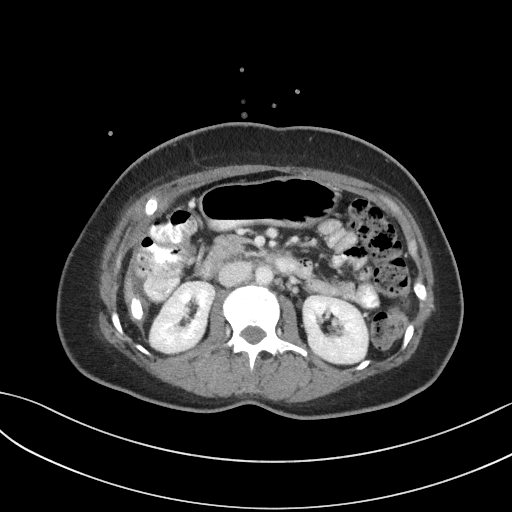
[im 68/97  soft-tissue]
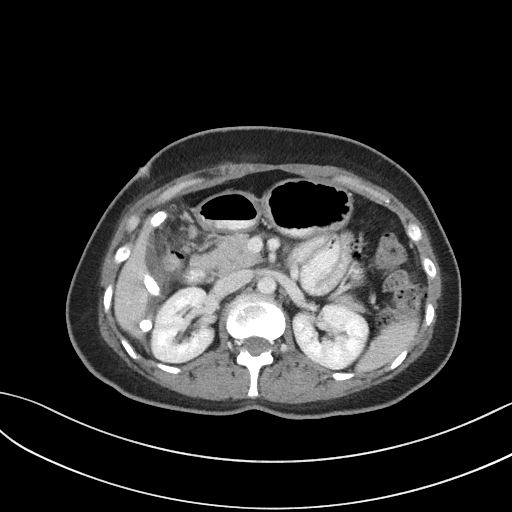
[im 68/97  bone]
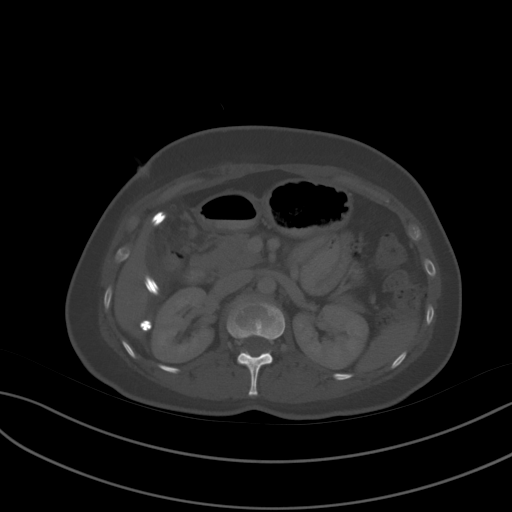
[im 74/97  soft-tissue]
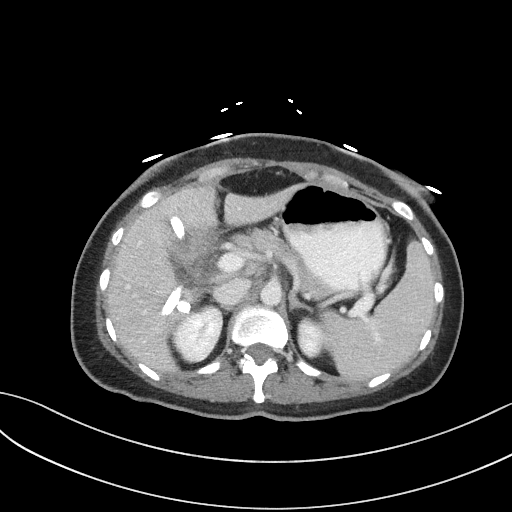
[im 85/97  soft-tissue]
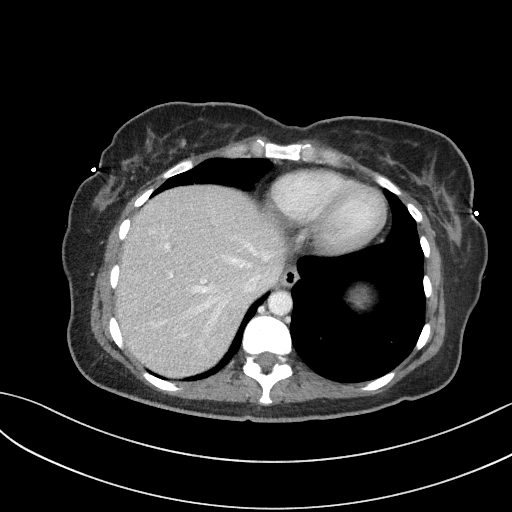
[im 91/97  soft-tissue]
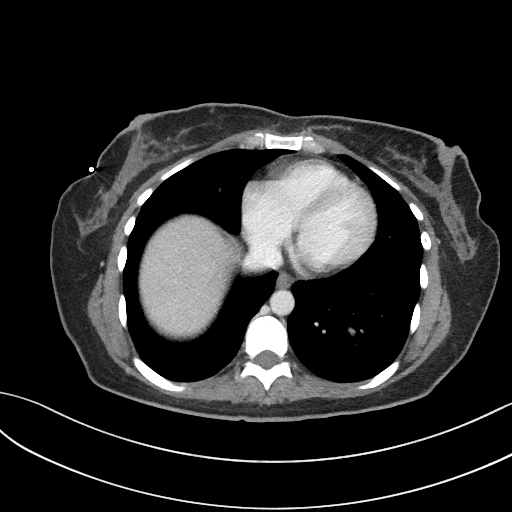

[Series 5: coronal st · coronal · 0.61mm/px · 3 of 79 slices shown]
[im 27/79  soft-tissue]
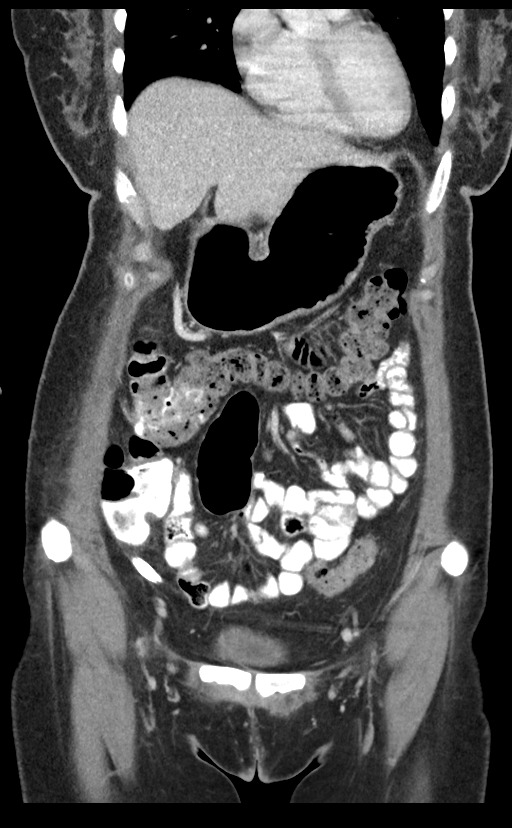
[im 35/79  soft-tissue]
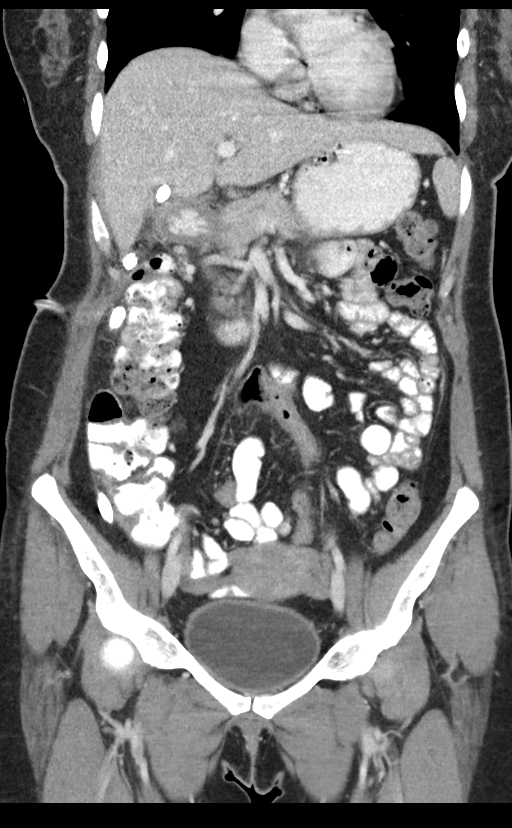
[im 44/79  soft-tissue]
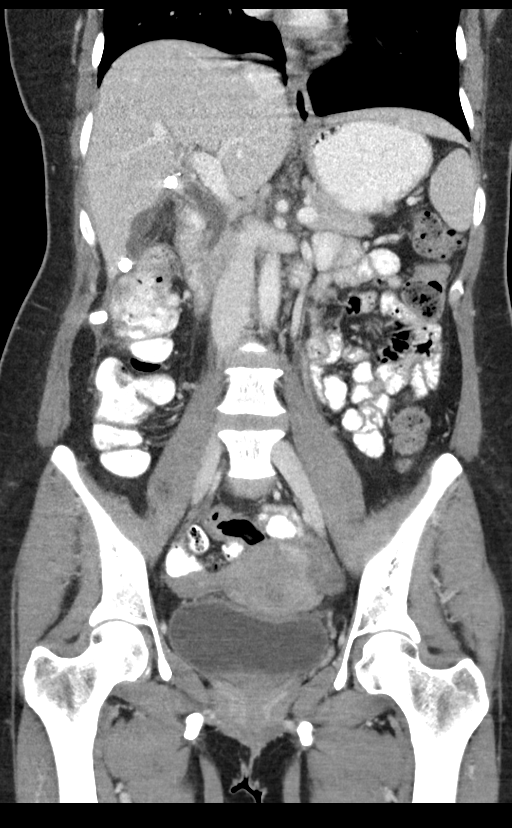

[15 of 46 positions shown; findings below may reference images not displayed]

FINDINGS: Lower chest: No acute abnormality.

Hepatobiliary: No focal liver abnormality identified. There are 2
surgical drains in place within the gallbladder fossa and along the
medial and inferior margin of right hepatic lobe. There is a small
volume of partially loculated fluid identified within the
gallbladder fossa extending along the inferior margin of right
hepatic lobe and into the right pericolic gutter. This appears
increased from CT dated 05/29/2021. Loculated low-density fluid
along the medial margin of right hepatic lobe in the gallbladder
fossa measures 2.6 x 1.3 x 3.2 cm, image [DATE] and image 44/5. The
common bile duct is mildly increased in caliber measuring 9 mm. No
intrahepatic bile duct dilatation.

Pancreas: Unremarkable. No pancreatic ductal dilatation or
surrounding inflammatory changes.

Spleen: Normal in size without focal abnormality.

Adrenals/Urinary Tract: Normal adrenal glands. No kidney mass or
hydronephrosis identified bilaterally.

Stomach/Bowel: Stomach appears within normal limits. The appendix is
visualized and appears normal. No bowel wall thickening,
inflammation, or distension.

Vascular/Lymphatic: Normal appearance of the abdominal aorta. No
abdominopelvic adenopathy.

Reproductive: Uterus and bilateral adnexa are unremarkable.

Other: None

Musculoskeletal: No acute or significant osseous findings.
IMPRESSION: 1. Status post cholecystectomy. Two surgical drains are in place
within the gallbladder fossa and along the medial and inferior
margin of right hepatic lobe. There is a small volume of partially
loculated fluid identified within the gallbladder fossa extending
along the inferior margin of right hepatic lobe and into the right
pericolic gutter. This appears increased from CT dated 05/29/2021,
and is concerning for small biloma.
